# Patient Record
Sex: Female | Born: 1980 | Race: White | Hispanic: No | Smoking: Current every day smoker
Health system: Southern US, Community
[De-identification: ages and names within clinical notes are randomized; demographics above are authoritative.]

## PROBLEM LIST (undated history)

## (undated) DIAGNOSIS — F1290 Cannabis use, unspecified, uncomplicated: Secondary | ICD-10-CM

## (undated) DIAGNOSIS — B182 Chronic viral hepatitis C: Secondary | ICD-10-CM

## (undated) DIAGNOSIS — F329 Major depressive disorder, single episode, unspecified: Secondary | ICD-10-CM

## (undated) DIAGNOSIS — F988 Other specified behavioral and emotional disorders with onset usually occurring in childhood and adolescence: Secondary | ICD-10-CM

## (undated) DIAGNOSIS — F1910 Other psychoactive substance abuse, uncomplicated: Secondary | ICD-10-CM

## (undated) DIAGNOSIS — F32A Depression, unspecified: Secondary | ICD-10-CM

## (undated) DIAGNOSIS — D649 Anemia, unspecified: Secondary | ICD-10-CM

## (undated) DIAGNOSIS — A6000 Herpesviral infection of urogenital system, unspecified: Secondary | ICD-10-CM

## (undated) DIAGNOSIS — F419 Anxiety disorder, unspecified: Secondary | ICD-10-CM

## (undated) DIAGNOSIS — R569 Unspecified convulsions: Secondary | ICD-10-CM

## (undated) DIAGNOSIS — Z87898 Personal history of other specified conditions: Secondary | ICD-10-CM

## (undated) HISTORY — DX: Unspecified convulsions: R56.9

## (undated) HISTORY — PX: NO PAST SURGERIES: SHX2092

## (undated) HISTORY — DX: Anxiety disorder, unspecified: F41.9

## (undated) HISTORY — DX: Other psychoactive substance abuse, uncomplicated: F19.10

## (undated) HISTORY — DX: Anemia, unspecified: D64.9

---
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
## 2009-07-02 ENCOUNTER — Inpatient Hospital Stay: Payer: Self-pay | Admitting: Psychiatry

## 2010-06-17 ENCOUNTER — Emergency Department: Payer: Self-pay | Admitting: Internal Medicine

## 2010-08-19 ENCOUNTER — Emergency Department: Payer: Self-pay | Admitting: Unknown Physician Specialty

## 2010-09-19 ENCOUNTER — Inpatient Hospital Stay: Payer: Self-pay | Admitting: Psychiatry

## 2010-10-08 ENCOUNTER — Encounter: Payer: Self-pay | Admitting: Maternal & Fetal Medicine

## 2010-10-08 ENCOUNTER — Emergency Department: Payer: Self-pay | Admitting: *Deleted

## 2010-12-10 ENCOUNTER — Encounter: Payer: Self-pay | Admitting: Obstetrics and Gynecology

## 2010-12-13 ENCOUNTER — Encounter: Payer: Self-pay | Admitting: Obstetrics and Gynecology

## 2010-12-31 ENCOUNTER — Observation Stay: Payer: Self-pay

## 2011-03-15 ENCOUNTER — Inpatient Hospital Stay: Payer: Self-pay

## 2011-03-15 LAB — CBC WITH DIFFERENTIAL/PLATELET
Basophil #: 0 10*3/uL (ref 0.0–0.1)
Eosinophil #: 0 10*3/uL (ref 0.0–0.7)
HCT: 31.7 % — ABNORMAL LOW (ref 35.0–47.0)
Lymphocyte #: 1.6 10*3/uL (ref 1.0–3.6)
Lymphocyte %: 14.3 %
MCHC: 35 g/dL (ref 32.0–36.0)
MCV: 92 fL (ref 80–100)
Neutrophil #: 8.8 10*3/uL — ABNORMAL HIGH (ref 1.4–6.5)
Platelet: 233 10*3/uL (ref 150–440)
RBC: 3.45 10*6/uL — ABNORMAL LOW (ref 3.80–5.20)
RDW: 11.5 % (ref 11.5–14.5)

## 2011-03-15 LAB — DRUG SCREEN, URINE
Barbiturates, Ur Screen: NEGATIVE (ref ?–200)
Cocaine Metabolite,Ur ~~LOC~~: NEGATIVE (ref ?–300)
Methadone, Ur Screen: NEGATIVE (ref ?–300)
Opiate, Ur Screen: NEGATIVE (ref ?–300)
Phencyclidine (PCP) Ur S: NEGATIVE (ref ?–25)

## 2011-03-16 LAB — HEMATOCRIT: HCT: 31.9 % — ABNORMAL LOW (ref 35.0–47.0)

## 2011-04-24 ENCOUNTER — Emergency Department: Payer: Self-pay | Admitting: Unknown Physician Specialty

## 2011-04-24 LAB — URINALYSIS, COMPLETE
Bilirubin,UR: NEGATIVE
Glucose,UR: NEGATIVE mg/dL (ref 0–75)
Leukocyte Esterase: NEGATIVE
Ph: 5 (ref 4.5–8.0)
Protein: NEGATIVE
RBC,UR: 6 /HPF (ref 0–5)
Specific Gravity: 1.016 (ref 1.003–1.030)
Squamous Epithelial: 3
WBC UR: 10 /HPF (ref 0–5)

## 2011-04-24 LAB — COMPREHENSIVE METABOLIC PANEL
Albumin: 3.8 g/dL (ref 3.4–5.0)
Alkaline Phosphatase: 68 U/L (ref 50–136)
Anion Gap: 6 — ABNORMAL LOW (ref 7–16)
Calcium, Total: 8.5 mg/dL (ref 8.5–10.1)
Chloride: 106 mmol/L (ref 98–107)
Creatinine: 0.74 mg/dL (ref 0.60–1.30)
Glucose: 90 mg/dL (ref 65–99)
Osmolality: 275 (ref 275–301)
Sodium: 138 mmol/L (ref 136–145)
Total Protein: 7.8 g/dL (ref 6.4–8.2)

## 2011-04-24 LAB — CBC
MCH: 31.4 pg (ref 26.0–34.0)
MCHC: 33.9 g/dL (ref 32.0–36.0)
Platelet: 167 10*3/uL (ref 150–440)
RBC: 3.8 10*6/uL (ref 3.80–5.20)
RDW: 12.5 % (ref 11.5–14.5)

## 2011-04-24 LAB — DRUG SCREEN, URINE
Amphetamines, Ur Screen: NEGATIVE (ref ?–1000)
Benzodiazepine, Ur Scrn: NEGATIVE (ref ?–200)
Cocaine Metabolite,Ur ~~LOC~~: NEGATIVE (ref ?–300)
MDMA (Ecstasy)Ur Screen: NEGATIVE (ref ?–500)
Methadone, Ur Screen: NEGATIVE (ref ?–300)
Phencyclidine (PCP) Ur S: POSITIVE (ref ?–25)

## 2011-04-24 LAB — MAGNESIUM: Magnesium: 1.8 mg/dL

## 2011-04-24 LAB — TSH: Thyroid Stimulating Horm: 3.15 u[IU]/mL

## 2011-04-24 LAB — CK TOTAL AND CKMB (NOT AT ARMC)
CK, Total: 302 U/L — ABNORMAL HIGH (ref 21–215)
CK-MB: 3.9 ng/mL — ABNORMAL HIGH (ref 0.5–3.6)

## 2011-10-21 ENCOUNTER — Emergency Department: Payer: Self-pay | Admitting: Emergency Medicine

## 2012-06-30 IMAGING — CT CT HEAD WITHOUT CONTRAST
2 series · 16 of 30 positions shown, 20 images · non-contrast
Comparison: none

REASON FOR EXAM: sz
COMMENTS:

PROCEDURE:     CT  - CT HEAD WITHOUT CONTRAST  - April 24, 2011 [DATE]
RESULT:     Comparison:  None
TECHNIQUE: Multiple axial images from the foramen magnum to the vertex were
obtained without IV contrast.

[Series 2: without · axial · non-contrast · 0.44mm/px · z∈[+417,+542]mm · 13 of 31 slices shown, 17 images]
[im 3/31  brain]
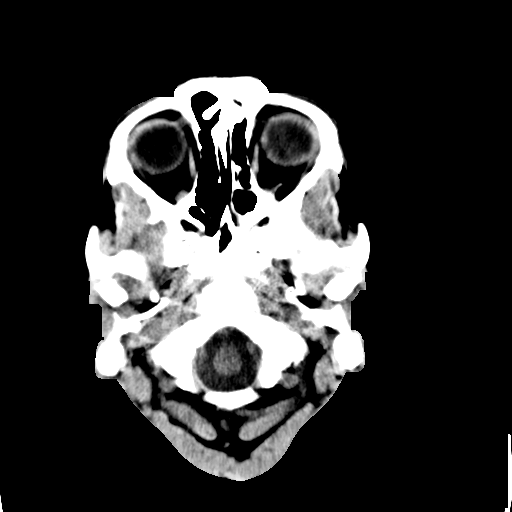
[im 3/31  bone]
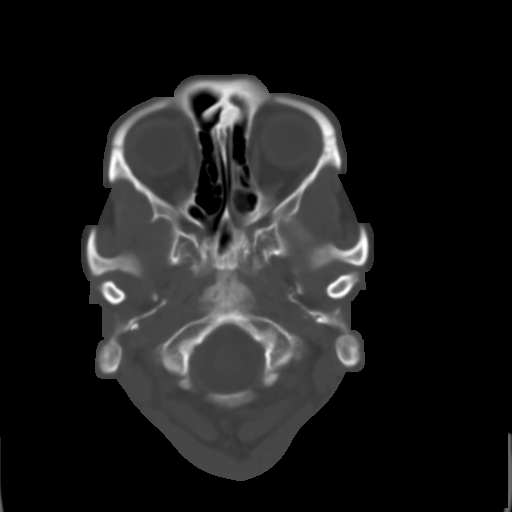
[im 5/31  brain]
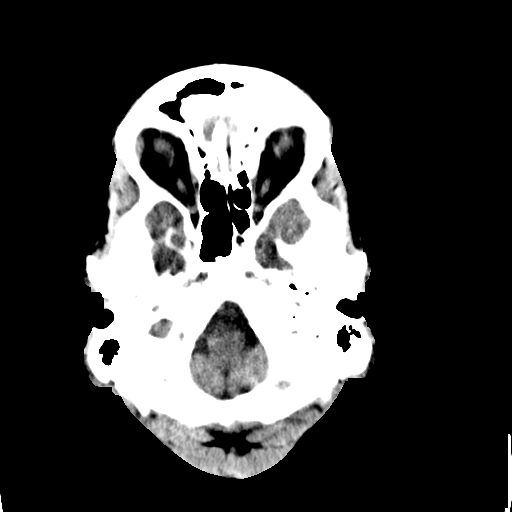
[im 7/31  brain]
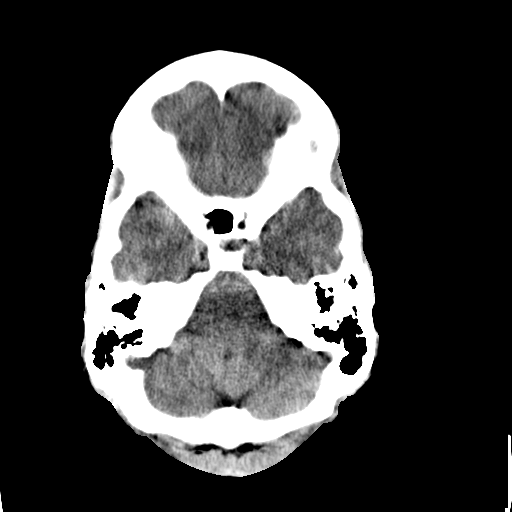
[im 9/31  brain]
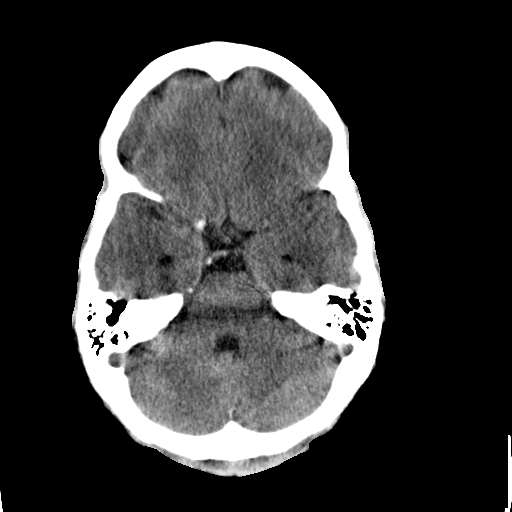
[im 11/31  brain]
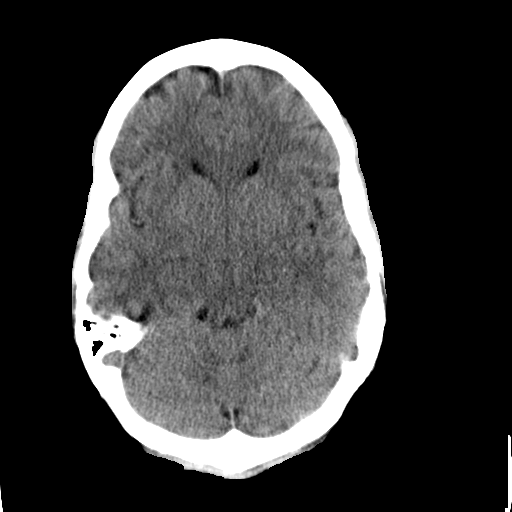
[im 11/31  bone]
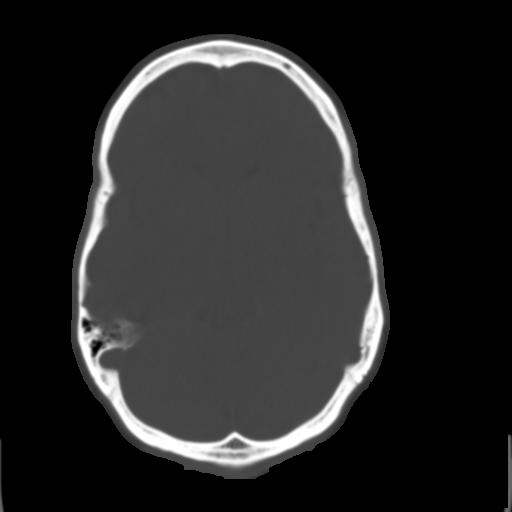
[im 13/31  brain]
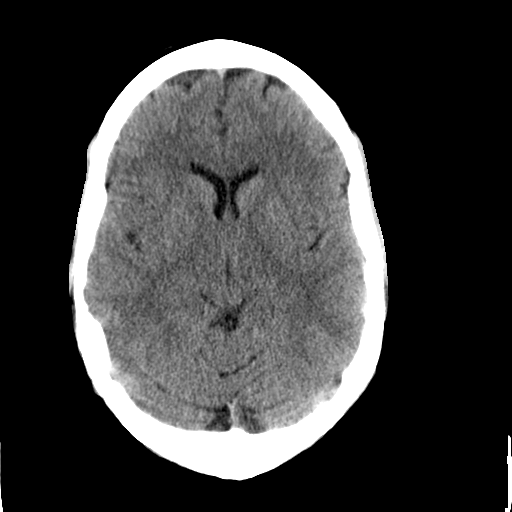
[im 16/31  brain]
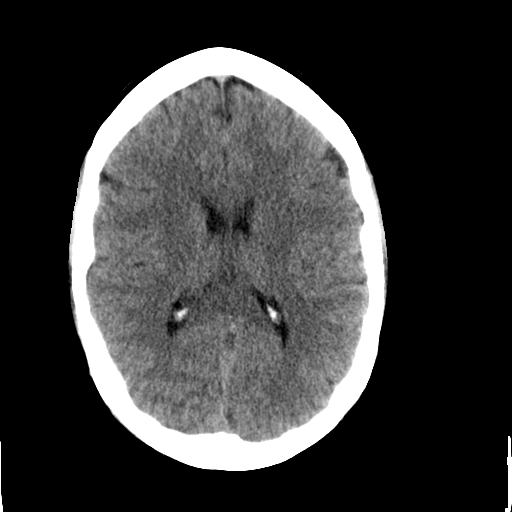
[im 18/31  brain]
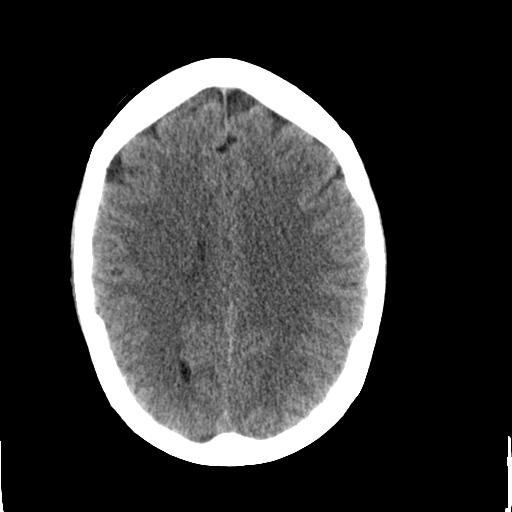
[im 20/31  brain]
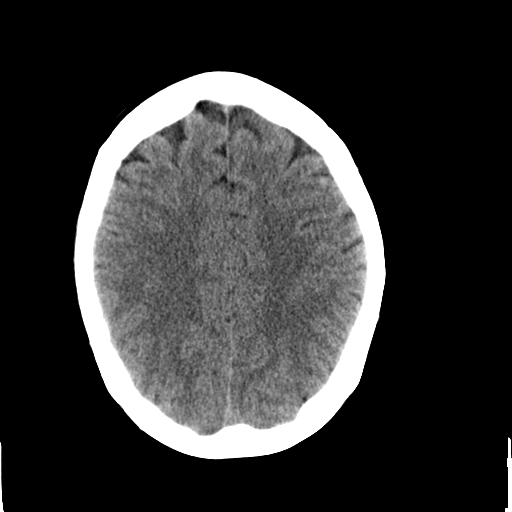
[im 20/31  bone]
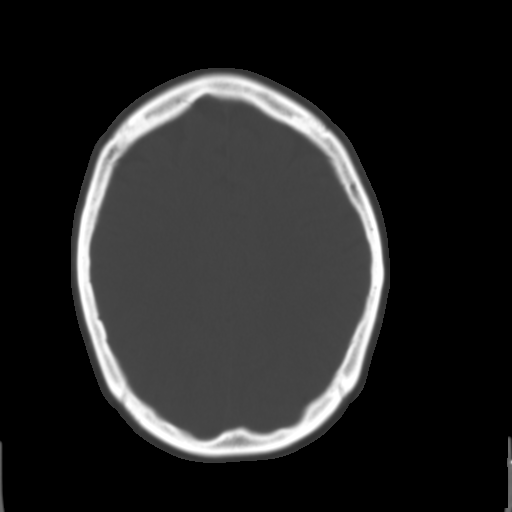
[im 22/31  brain]
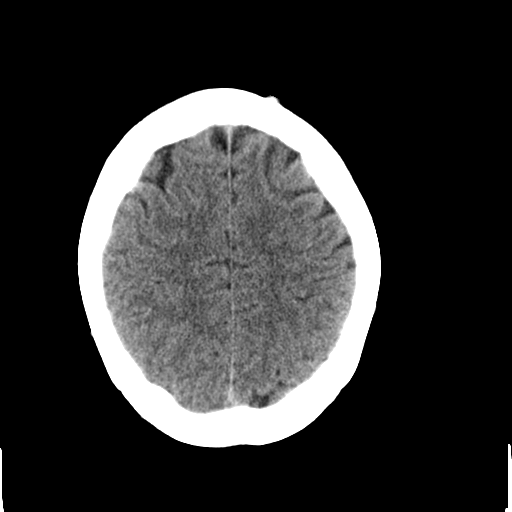
[im 24/31  brain]
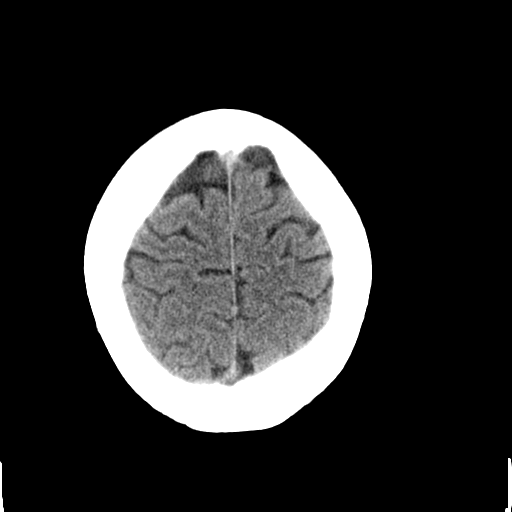
[im 26/31  brain]
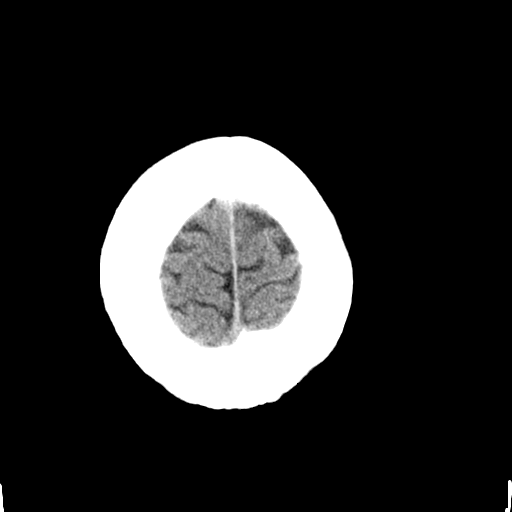
[im 28/31  brain]
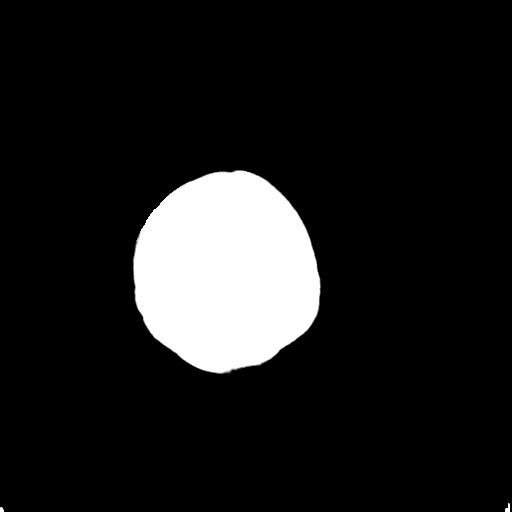
[im 28/31  bone]
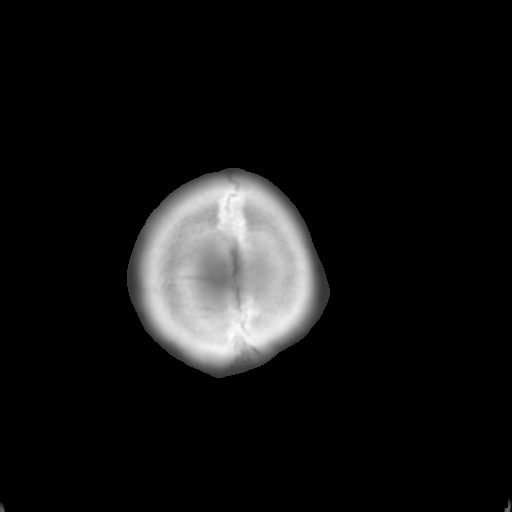

[Series 3: bone · axial · 0.44mm/px · z∈[+417,+457]mm · 3 of 31 slices shown]
[im 3/31  bone]
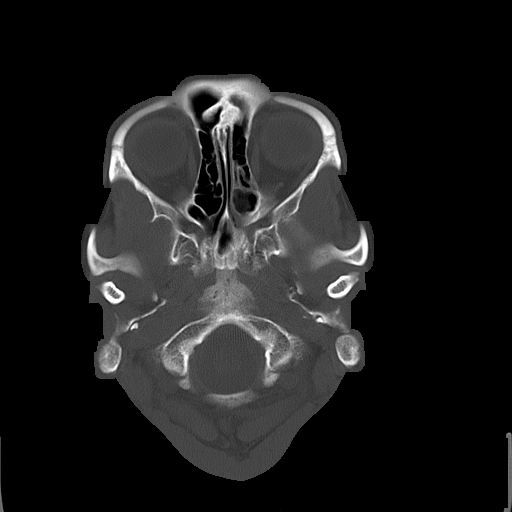
[im 7/31  bone]
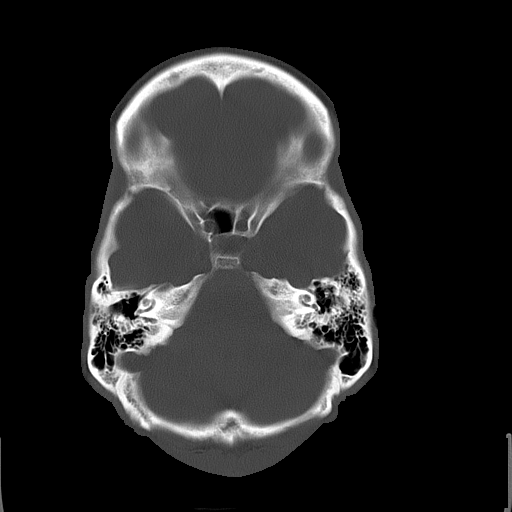
[im 11/31  bone]
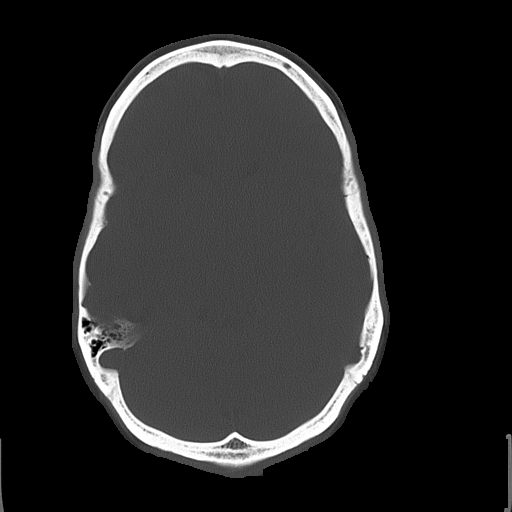

[16 of 30 positions shown; findings below may reference images not displayed]

FINDINGS: There is no evidence for mass effect, midline shift, or extra-axial fluid
collections. There is no evidence for space-occupying lesion, intracranial
hemorrhage, or cortical-based area of infarction. Small focus of soft tissue
thickening in the left frontal scalp is nonspecific.

The osseous structures are unremarkable.
IMPRESSION: No acute intracranial process.

## 2016-02-09 ENCOUNTER — Encounter: Payer: Self-pay | Admitting: Emergency Medicine

## 2016-02-09 ENCOUNTER — Emergency Department: Payer: Self-pay

## 2016-02-09 ENCOUNTER — Emergency Department
Admission: EM | Admit: 2016-02-09 | Discharge: 2016-02-09 | Disposition: A | Discharge status: Home / Self Care | Payer: Self-pay | Attending: Emergency Medicine | Admitting: Emergency Medicine

## 2016-02-09 DIAGNOSIS — N3090 Cystitis, unspecified without hematuria: Secondary | ICD-10-CM | POA: Insufficient documentation

## 2016-02-09 DIAGNOSIS — F17200 Nicotine dependence, unspecified, uncomplicated: Secondary | ICD-10-CM | POA: Insufficient documentation

## 2016-02-09 LAB — URINALYSIS, COMPLETE (UACMP) WITH MICROSCOPIC
BACTERIA UA: NONE SEEN
BILIRUBIN URINE: NEGATIVE
GLUCOSE, UA: NEGATIVE mg/dL
KETONES UR: NEGATIVE mg/dL
NITRITE: NEGATIVE
PROTEIN: 100 mg/dL — AB
Specific Gravity, Urine: 1.02 (ref 1.005–1.030)
pH: 5 (ref 5.0–8.0)

## 2016-02-09 LAB — COMPREHENSIVE METABOLIC PANEL
ALBUMIN: 4.1 g/dL (ref 3.5–5.0)
ALT: 188 U/L — ABNORMAL HIGH (ref 14–54)
ANION GAP: 6 (ref 5–15)
AST: 100 U/L — AB (ref 15–41)
Alkaline Phosphatase: 54 U/L (ref 38–126)
BILIRUBIN TOTAL: 0.8 mg/dL (ref 0.3–1.2)
BUN: 12 mg/dL (ref 6–20)
CHLORIDE: 103 mmol/L (ref 101–111)
CO2: 28 mmol/L (ref 22–32)
Calcium: 8.9 mg/dL (ref 8.9–10.3)
Creatinine, Ser: 0.63 mg/dL (ref 0.44–1.00)
GFR calc Af Amer: >60 mL/min (ref >60)
Glucose, Bld: 92 mg/dL (ref 65–99)
POTASSIUM: 3.7 mmol/L (ref 3.5–5.1)
Sodium: 137 mmol/L (ref 135–145)
TOTAL PROTEIN: 8 g/dL (ref 6.5–8.1)

## 2016-02-09 LAB — CBC
HEMATOCRIT: 35.7 % (ref 35.0–47.0)
HEMOGLOBIN: 12.6 g/dL (ref 12.0–16.0)
MCH: 30.9 pg (ref 26.0–34.0)
MCHC: 35.3 g/dL (ref 32.0–36.0)
MCV: 87.6 fL (ref 80.0–100.0)
Platelets: 274 10*3/uL (ref 150–440)
RBC: 4.08 MIL/uL (ref 3.80–5.20)
RDW: 13.2 % (ref 11.5–14.5)
WBC: 8.2 10*3/uL (ref 3.6–11.0)

## 2016-02-09 LAB — POCT PREGNANCY, URINE: PREG TEST UR: NEGATIVE

## 2016-02-09 MED ORDER — SULFAMETHOXAZOLE-TRIMETHOPRIM 800-160 MG PO TABS
1.0000 | ORAL_TABLET | Freq: Once | ORAL | Status: AC
  Administered 2016-02-09: 1 via ORAL
  Filled 2016-02-09: qty 1

## 2016-02-09 MED ORDER — SULFAMETHOXAZOLE-TRIMETHOPRIM 800-160 MG PO TABS: 1.0000 | ORAL_TABLET | Freq: Two times a day (BID) | ORAL | 0 refills | Status: DC

## 2016-02-09 MED ORDER — PHENAZOPYRIDINE HCL 200 MG PO TABS
200.0000 mg | ORAL_TABLET | Freq: Once | ORAL | Status: AC
  Administered 2016-02-09: 200 mg via ORAL
  Filled 2016-02-09 (×2): qty 1

## 2016-02-09 MED ORDER — PHENAZOPYRIDINE HCL 200 MG PO TABS: 200.0000 mg | ORAL_TABLET | Freq: Three times a day (TID) | ORAL | 0 refills | Status: AC | PRN

## 2016-02-09 NOTE — ED Triage Notes (Addendum)
Says urinary urgency and frequency for about 1.5 weeks ago.  Also low mid abd pressure.  She says there may have been clot of blood in urine today, but she also has menses now.  Has been trying home remedies without relief.  Also says she has excessive thirst and would like to be checked for diabetes.

## 2016-02-09 NOTE — ED Provider Notes (Signed)
[facility] Emergency Department Provider Note        Time seen: ----------------------------------------- 2:27 PM on 02/09/2016 -----------------------------------------    I have reviewed the triage vital signs and the nursing notes.   HISTORY  Chief Complaint Urinary Frequency; Hematuria; and Polydipsia    HPI Jillian Phillips is a 35 y.o. female who presents to the ER for urinary frequency and urgency for about a week and half. She also has had some pelvic pressure. Patient states there may have been a clot in her urine today but she also has her menses currently. She did try and home remedies without improvement. She also has excessive thirst and wants to be checked for diabetes. Boyfriend is concerned she may have a kidney stone.   History reviewed. No pertinent past medical history.  There are no active problems to display for this patient.   History reviewed. No pertinent surgical history.  Allergies Patient has no known allergies.  Social History Social History  Substance Use Topics  . Smoking status: Current Every Day Smoker  . Smokeless tobacco: Never Used  . Alcohol use No    Review of Systems Constitutional: Negative for fever. Cardiovascular: Negative for chest pain. Respiratory: Negative for shortness of breath. Gastrointestinal: Positive for abdominal pain Genitourinary: Positive for dysuria Musculoskeletal: Negative for back pain. Skin: Negative for rash. Neurological: Negative for headaches, focal weakness or numbness.  10-point ROS otherwise negative.  ____________________________________________   PHYSICAL EXAM:  VITAL SIGNS: ED Triage Vitals  Enc Vitals Group     BP 02/09/16 1029 (!) 136/93     Pulse Rate 02/09/16 1029 90     Resp 02/09/16 1029 16     Temp 02/09/16 1029 97.4 F (36.3 C)     Temp Source 02/09/16 1029 Oral     SpO2 02/09/16 1029 97 %     Weight 02/09/16 1030 145 lb (65.8 kg)     Height  02/09/16 1030 5\' 8"  (1.727 m)     Head Circumference --      Peak Flow --      Pain Score 02/09/16 1031 7     Pain Loc --      Pain Edu? --      Excl. in [place]? --    Constitutional: Alert and oriented. Well appearing and in no distress. Eyes: Conjunctivae are normal. PERRL. Normal extraocular movements. ENT   Head: Normocephalic and atraumatic.   Nose: No congestion/rhinnorhea.   Mouth/Throat: Mucous membranes are moist.   Neck: No stridor. Cardiovascular: Normal rate, regular rhythm. No murmurs, rubs, or gallops. Respiratory: Normal respiratory effort without tachypnea nor retractions. Breath sounds are clear and equal bilaterally. No wheezes/rales/rhonchi. Gastrointestinal: Soft and nontender. Normal bowel sounds Musculoskeletal: Nontender with normal range of motion in all extremities. No lower extremity tenderness nor edema. Neurologic:  Normal speech and language. No gross focal neurologic deficits are appreciated.  Skin:  Skin is warm, dry and intact. No rash noted. Psychiatric: Mood and affect are normal. Speech and behavior are normal.  ___________________________________________  ED COURSE:  Pertinent labs & imaging results that were available during my care of the patient were reviewed by me and considered in my medical decision making (see chart for details). Clinical Course   Patient is no distress, we will assess with labs and possibly imaging.  Procedures ____________________________________________   LABS (pertinent positives/negatives)  Labs Reviewed  COMPREHENSIVE METABOLIC PANEL - Abnormal; Notable for the following:       Result Value  AST 100 (*)    ALT 188 (*)    All other components within normal limits  URINALYSIS, COMPLETE (UACMP) WITH MICROSCOPIC - Abnormal; Notable for the following:    Color, Urine AMBER (*)    APPearance CLOUDY (*)    Hgb urine dipstick LARGE (*)    Protein, ur 100 (*)    Leukocytes, UA MODERATE (*)    Squamous  Epithelial / LPF 0-5 (*)    All other components within normal limits  CBC  POC URINE PREG, ED  POCT PREGNANCY, URINE    RADIOLOGY  KUB IMPRESSION: Probable 2 adjacent small left lower pole renal calculi. No ureteral calculi are seen. ____________________________________________  FINAL ASSESSMENT AND PLAN  Cystitis  Plan: Patient with labs and imaging as dictated above. Patient has had no symptoms of renal colic and has evidence of UTI while on her menses. We will continue treatment with Pyridium and Septra DS. She is stable for outpatient follow-up.   Earleen Newport, MD   Note: This dictation was prepared with Dragon dictation. Any transcriptional errors that result from this process are unintentional    Earleen Newport, MD 02/09/16 1433

## 2018-10-16 ENCOUNTER — Emergency Department: Payer: Self-pay

## 2018-10-16 ENCOUNTER — Other Ambulatory Visit: Payer: Self-pay

## 2018-10-16 ENCOUNTER — Emergency Department
Admission: EM | Admit: 2018-10-16 | Discharge: 2018-10-16 | Disposition: A | Discharge status: Home / Self Care | Payer: Self-pay | Attending: Emergency Medicine | Admitting: Emergency Medicine

## 2018-10-16 DIAGNOSIS — Y999 Unspecified external cause status: Secondary | ICD-10-CM | POA: Insufficient documentation

## 2018-10-16 DIAGNOSIS — F17200 Nicotine dependence, unspecified, uncomplicated: Secondary | ICD-10-CM | POA: Insufficient documentation

## 2018-10-16 DIAGNOSIS — S61111A Laceration without foreign body of right thumb with damage to nail, initial encounter: Secondary | ICD-10-CM | POA: Insufficient documentation

## 2018-10-16 DIAGNOSIS — Z23 Encounter for immunization: Secondary | ICD-10-CM | POA: Insufficient documentation

## 2018-10-16 DIAGNOSIS — Y939 Activity, unspecified: Secondary | ICD-10-CM | POA: Insufficient documentation

## 2018-10-16 DIAGNOSIS — W268XXA Contact with other sharp object(s), not elsewhere classified, initial encounter: Secondary | ICD-10-CM | POA: Insufficient documentation

## 2018-10-16 DIAGNOSIS — Y929 Unspecified place or not applicable: Secondary | ICD-10-CM | POA: Insufficient documentation

## 2018-10-16 MED ORDER — LIDOCAINE HCL (PF) 1 % IJ SOLN
5.0000 mL | Freq: Once | INTRAMUSCULAR | Status: AC
  Administered 2018-10-16: 16:00:00 5 mL via INTRADERMAL
  Filled 2018-10-16: qty 5

## 2018-10-16 MED ORDER — TETANUS-DIPHTH-ACELL PERTUSSIS 5-2.5-18.5 LF-MCG/0.5 IM SUSP
0.5000 mL | Freq: Once | INTRAMUSCULAR | Status: AC
  Administered 2018-10-16: 16:00:00 0.5 mL via INTRAMUSCULAR
  Filled 2018-10-16: qty 0.5

## 2018-10-16 MED ORDER — CEPHALEXIN 500 MG PO CAPS: 500.0000 mg | ORAL_CAPSULE | Freq: Four times a day (QID) | ORAL | 0 refills | Status: AC

## 2018-10-16 NOTE — ED Notes (Addendum)
This RN has stopped by this pt's room x2 to perform wound care, with pt not being there. Other RN states that pt asked for restroom, restroom is empty at the time. Will check back shortly.  Caryl Pina, [place] made aware

## 2018-10-16 NOTE — ED Triage Notes (Signed)
Laceration to right hand, thumb with can approx 2 hours PTA. Thumb bleeding, rewrapped at time of triage. Pt alert and oriented X4, cooperative, RR even and unlabored, color WNL. Pt in NAD. Pt tearful.

## 2018-10-16 NOTE — ED Provider Notes (Signed)
[facility] Emergency Department Provider Note  ____________________________________________  Time seen: Approximately 3:48 PM  I have reviewed the triage vital signs and the nursing notes.   HISTORY  Chief Complaint Extremity Laceration    HPI Jillian Phillips is a 38 y.o. female that presents to the emergency department for evaluation of right thumb laceration.  Patient cut her thumb on a can that she was trying to open.  She is unsure of last tetanus.   History reviewed. No pertinent past medical history.  There are no active problems to display for this patient.   History reviewed. No pertinent surgical history.  Prior to Admission medications   Medication Sig Start Date End Date Taking? Authorizing Provider  cephALEXin (KEFLEX) 500 MG capsule Take 1 capsule (500 mg total) by mouth 4 (four) times daily for 10 days. 10/16/18 10/26/18  Laban Emperor, PA-C  sulfamethoxazole-trimethoprim (BACTRIM DS) 800-160 MG tablet Take 1 tablet by mouth 2 (two) times daily. 02/09/16   Earleen Newport, MD    Allergies Patient has no known allergies.  No family history on file.  Social History Social History   Tobacco Use  . Smoking status: Current Every Day Smoker  . Smokeless tobacco: Never Used  Substance Use Topics  . Alcohol use: No  . Drug use: Not on file     Review of Systems   Gastrointestinal: No abdominal pain.  No nausea, no vomiting.  Musculoskeletal: Positive for thumb pain. Skin: Negative for rash, ecchymosis.  Positive for laceration. Neurological: Negative for numbness or tingling   ____________________________________________   PHYSICAL EXAM:  VITAL SIGNS: ED Triage Vitals  Enc Vitals Group     BP 10/16/18 1506 (!) 183/91     Pulse Rate 10/16/18 1506 74     Resp 10/16/18 1506 20     Temp 10/16/18 1506 98.5 F (36.9 C)     Temp Source 10/16/18 1506 Oral     SpO2 10/16/18 1506 100 %     Weight 10/16/18 1506 158 lb (71.7  kg)     Height 10/16/18 1506 5\' 9"  (1.753 m)     Head Circumference --      Peak Flow --      Pain Score 10/16/18 1515 0     Pain Loc --      Pain Edu? --      Excl. in [place]? --      Constitutional: Alert and oriented. Well appearing and in no acute distress. Eyes: Conjunctivae are normal. PERRL. EOMI. Head: Atraumatic. ENT:      Ears:      Nose: No congestion/rhinnorhea.      Mouth/Throat: Mucous membranes are moist.  Neck: No stridor.   Cardiovascular: Normal rate, regular rhythm.  Good peripheral circulation. Respiratory: Normal respiratory effort without tachypnea or retractions. Lungs CTAB. Good air entry to the bases with no decreased or absent breath sounds. Musculoskeletal: Full range of motion to all extremities. No gross deformities appreciated. Neurologic:  Normal speech and language. No gross focal neurologic deficits are appreciated.  Skin:  Skin is warm. 2 cm flap laceration to right distal thumb through nail. Psychiatric: Mood and affect are normal. Speech and behavior are normal. Patient exhibits appropriate insight and judgement.   ____________________________________________   LABS (all labs ordered are listed, but only abnormal results are displayed)  Labs Reviewed - No data to display ____________________________________________  EKG   ____________________________________________  RADIOLOGY Robinette Haines, personally viewed and evaluated these images (plain  radiographs) as part of my medical decision making, as well as reviewing the written report by the radiologist.  No results found.  ____________________________________________    PROCEDURES  Procedure(s) performed:    Procedures  LACERATION REPAIR Performed by: Laban Emperor  Consent: Verbal consent obtained.  Consent given by: patient  Prepped and Draped in normal sterile fashion  Wound explored: No foreign bodies   Laceration Location: thumb  Laceration Length: 2  cm  Anesthesia: None  Local anesthetic: lidocaine 1% without epinephrine  Anesthetic total: 5  ml  Irrigation method: syringe  Amount of cleaning: 552ml normal saline  Skin closure: 4-0 nylon  Number of sutures: 8  Technique: Simple interrupted  Patient tolerance: Patient tolerated the procedure well with no immediate complications.  Medications  lidocaine (PF) (XYLOCAINE) 1 % injection 5 mL (5 mLs Intradermal Given 10/16/18 1609)  Tdap (BOOSTRIX) injection 0.5 mL (0.5 mLs Intramuscular Given 10/16/18 1608)     ____________________________________________   INITIAL IMPRESSION / ASSESSMENT AND PLAN / ED COURSE  Pertinent labs & imaging results that were available during my care of the patient were reviewed by me and considered in my medical decision making (see chart for details).  Review of the [place] CSRS was performed in accordance of the [place] prior to dispensing any controlled drugs.     Patient presented to the emergency department for evaluation after thumb injury.  Vital signs and exam are reassuring.  Patient declines x-ray.  Thumb was repaired with stitches.  Portion of nail that was damaged was removed.   Patient will be discharged home with prescriptions for Keflex. Patient is to follow up with primary care Ortho as directed. Patient is given ED precautions to return to the ED for any worsening or new symptoms.   Jillian Phillips was evaluated in Emergency Department on 10/16/2018 for the symptoms described in the history of present illness. She was evaluated in the context of the global COVID-19 pandemic, which necessitated consideration that the patient might be at risk for infection with the SARS-CoV-2 virus that causes COVID-19. Institutional protocols and algorithms that pertain to the evaluation of patients at risk for COVID-19 are in a state of rapid change based on information released by regulatory bodies including the CDC and federal and state organizations. These  policies and algorithms were followed during the patient's care in the ED.  ____________________________________________  FINAL CLINICAL IMPRESSION(S) / ED DIAGNOSES  Final diagnoses:  Laceration of right thumb without foreign body with damage to nail, initial encounter      NEW MEDICATIONS STARTED DURING THIS VISIT:  ED Discharge Orders         Ordered    cephALEXin (KEFLEX) 500 MG capsule  4 times daily     10/16/18 1704              This chart was dictated using voice recognition software/Dragon. Despite best efforts to proofread, errors can occur which can change the meaning. Any change was purely unintentional.    Laban Emperor, PA-C 10/16/18 2051    Harvest Dark, MD 10/16/18 [phone]

## 2018-10-16 NOTE — ED Notes (Signed)
PT not in room.  

## 2018-10-16 NOTE — ED Notes (Signed)
See triage note  Presents with laceration to right thumb  States she cut it on a cann  Laceration noted across nail

## 2019-02-12 NOTE — L&D Delivery Note (Signed)
Delivery Note At 11:30 AM a viable female was delivered via Vaginal, Spontaneous (Presentation: Right Occiput Anterior).  APGAR: 7, 9; weight pending.   Placenta status: Spontaneous, Intact.  Cord: 3 vessels with the following complications: None.  Cord pH: n/a  Anesthesia: Epidural Episiotomy: None Lacerations: None Suture Repair: n/a Qnt. Blood Loss (mL): 462  Mom to postpartum.  Baby to Couplet care / Skin to Skin.  Called to see patient.  Mom pushed to deliver a viable female infant.  The head followed by shoulders, which delivered without difficulty, and the rest of the body.  A triple nuchal cord noted and reduced easily at the perineum.  Baby to mom's chest.  Cord clamped and cut after > 1 min delay.  No cord blood obtained.  Placenta delivered spontaneously, intact, with a 3-vessel cord.  No vaginal, cervical, or perineal lacerations. All counts correct.  Hemostasis obtained with IV pitocin and fundal massage. She had several short bursts of increased bleeding. She was treated with misoprostol 800 mcg PR x 1.  QBL 462 mL.     Prentice Docker, MD 09/28/2019, 12:11 PM

## 2019-04-09 ENCOUNTER — Other Ambulatory Visit: Payer: Self-pay

## 2019-04-09 ENCOUNTER — Emergency Department
Admission: EM | Admit: 2019-04-09 | Discharge: 2019-04-09 | Disposition: A | Discharge status: Home / Self Care | Payer: Medicaid Other | Attending: Emergency Medicine | Admitting: Emergency Medicine

## 2019-04-09 ENCOUNTER — Emergency Department: Payer: Medicaid Other

## 2019-04-09 DIAGNOSIS — Z3A11 11 weeks gestation of pregnancy: Secondary | ICD-10-CM | POA: Diagnosis not present

## 2019-04-09 DIAGNOSIS — N939 Abnormal uterine and vaginal bleeding, unspecified: Secondary | ICD-10-CM

## 2019-04-09 DIAGNOSIS — O209 Hemorrhage in early pregnancy, unspecified: Secondary | ICD-10-CM | POA: Diagnosis present

## 2019-04-09 DIAGNOSIS — F17200 Nicotine dependence, unspecified, uncomplicated: Secondary | ICD-10-CM | POA: Diagnosis not present

## 2019-04-09 LAB — CBC
HCT: 38 % (ref 36.0–46.0)
Hemoglobin: 12.6 g/dL (ref 12.0–15.0)
MCH: 29.1 pg (ref 26.0–34.0)
MCHC: 33.2 g/dL (ref 30.0–36.0)
MCV: 87.8 fL (ref 80.0–100.0)
Platelets: 272 10*3/uL (ref 150–400)
RBC: 4.33 MIL/uL (ref 3.87–5.11)
RDW: 14.2 % (ref 11.5–15.5)
WBC: 9.6 10*3/uL (ref 4.0–10.5)
nRBC: 0 % (ref 0.0–0.2)

## 2019-04-09 LAB — HCG, QUANTITATIVE, PREGNANCY: hCG, Beta Chain, Quant, S: 101164 m[IU]/mL — ABNORMAL HIGH (ref <5)

## 2019-04-09 LAB — BASIC METABOLIC PANEL
Anion gap: 6 (ref 5–15)
BUN: 15 mg/dL (ref 6–20)
CO2: 25 mmol/L (ref 22–32)
Calcium: 8.9 mg/dL (ref 8.9–10.3)
Chloride: 101 mmol/L (ref 98–111)
Creatinine, Ser: 0.56 mg/dL (ref 0.44–1.00)
GFR calc Af Amer: >60 mL/min (ref >60)
GFR calc non Af Amer: >60 mL/min (ref >60)
Glucose, Bld: 110 mg/dL — ABNORMAL HIGH (ref 70–99)
Potassium: 3.7 mmol/L (ref 3.5–5.1)
Sodium: 132 mmol/L — ABNORMAL LOW (ref 135–145)

## 2019-04-09 LAB — POC URINE PREG, ED: Preg Test, Ur: POSITIVE — AB

## 2019-04-09 LAB — ABO/RH: ABO/RH(D): A POS

## 2019-04-09 NOTE — ED Triage Notes (Signed)
Pt comes via POV from home with c/o vaginal bleeding that started today. Pt states she took a home pregnancy test a couple weeks ago and it was positive.  Pt states she has not had a OBGYN visit yet or confirmed pregnancy through doctor.  Pt denies any abdominal cramping.

## 2019-04-09 NOTE — ED Notes (Signed)
Pt not in room for discharge vs or instructions

## 2019-04-09 NOTE — ED Provider Notes (Signed)
Emergency Department Provider Note  ____________________________________________  Time seen: Approximately 4:46 PM  I have reviewed the triage vital signs and the nursing notes.   HISTORY  Chief Complaint Vaginal Bleeding   Historian Patient     HPI Jillian Phillips is a 39 y.o. female G3 P2 presents to the emergency department with vaginal bleeding that started today after sexual intercourse.  Patient states that she has not had a menstrual cycle since December.  She has noticed some breast swelling and other symptoms consistent with her prior pregnancies.  She denies pelvic cramping or abdominal pain.  She states that she took an at home pregnancy test several weeks ago and it was positive.  She has not made an appointment to see an OB/GYN.  She denies nausea or vomiting. No other alleviating measures have been attempted.    History reviewed. No pertinent past medical history.   Immunizations up to date:  Yes.     History reviewed. No pertinent past medical history.  There are no problems to display for this patient.   History reviewed. No pertinent surgical history.  Prior to Admission medications   Medication Sig Start Date End Date Taking? Authorizing Provider  sulfamethoxazole-trimethoprim (BACTRIM DS) 800-160 MG tablet Take 1 tablet by mouth 2 (two) times daily. 02/09/16   Earleen Newport, MD    Allergies Patient has no known allergies.  No family history on file.  Social History Social History   Tobacco Use  . Smoking status: Current Every Day Smoker  . Smokeless tobacco: Never Used  Substance Use Topics  . Alcohol use: No  . Drug use: Not on file     Review of Systems  Constitutional: No fever/chills Eyes:  No discharge ENT: No upper respiratory complaints. Respiratory: no cough. No SOB/ use of accessory muscles to breath Gastrointestinal:   No nausea, no vomiting.  No diarrhea.  No constipation. Genitourinary: Patient has vaginal  bleeding.  Musculoskeletal: Negative for musculoskeletal pain. Skin: Negative for rash, abrasions, lacerations, ecchymosis.    ____________________________________________   PHYSICAL EXAM:  VITAL SIGNS: ED Triage Vitals  Enc Vitals Group     BP 04/09/19 1500 (!) 139/96     Pulse Rate 04/09/19 1500 94     Resp 04/09/19 1500 18     Temp 04/09/19 1500 97.6 F (36.4 C)     Temp src --      SpO2 04/09/19 1500 100 %     Weight 04/09/19 1501 160 lb (72.6 kg)     Height 04/09/19 1501 5\' 8"  (1.727 m)     Head Circumference --      Peak Flow --      Pain Score 04/09/19 1501 0     Pain Loc --      Pain Edu? --      Excl. in [place]? --      Constitutional: Alert and oriented. Well appearing and in no acute distress. Eyes: Conjunctivae are normal. PERRL. EOMI. Head: Atraumatic. ENT: Cardiovascular: Normal rate, regular rhythm. Normal S1 and S2.  Good peripheral circulation. Respiratory: Normal respiratory effort without tachypnea or retractions. Lungs CTAB. Good air entry to the bases with no decreased or absent breath sounds Gastrointestinal: Bowel sounds x 4 quadrants. Soft and nontender to palpation. No guarding or rigidity. No distention. Musculoskeletal: Full range of motion to all extremities. No obvious deformities noted Neurologic:  Normal for age. No gross focal neurologic deficits are appreciated.  Skin:  Skin is warm, dry and intact.  No rash noted. Psychiatric: Mood and affect are normal for age. Speech and behavior are normal.   ____________________________________________   LABS (all labs ordered are listed, but only abnormal results are displayed)  Labs Reviewed  BASIC METABOLIC PANEL - Abnormal; Notable for the following components:      Result Value   Sodium 132 (*)    Glucose, Bld 110 (*)    All other components within normal limits  HCG, QUANTITATIVE, PREGNANCY - Abnormal; Notable for the following components:   hCG, Beta Chain, Quant, S 101,164 (*)    All  other components within normal limits  POC URINE PREG, ED - Abnormal; Notable for the following components:   Preg Test, Ur Positive (*)    All other components within normal limits  CBC  ABO/RH   ____________________________________________  EKG   ____________________________________________  RADIOLOGY Unk Pinto, personally viewed and evaluated these images (plain radiographs) as part of my medical decision making, as well as reviewing the written report by the radiologist.    US OB Comp Less 14 Wks  Result Date: 04/09/2019 CLINICAL DATA:  Vaginal bleeding EXAM: OBSTETRIC <14 WK ULTRASOUND TECHNIQUE: Transabdominal ultrasound was performed for evaluation of the gestation as well as the maternal uterus and adnexal regions. COMPARISON:  None. FINDINGS: Intrauterine gestational sac: Single Yolk sac:  Visualized. Embryo:  Visualized. Cardiac Activity: Visualized. Heart Rate: 169 bpm CRL: 51.7  mm   11 w 6 d                  [place] EDC: 10/23/2019 Subchorionic hemorrhage:  None visualized. Maternal uterus/adnexae: No pelvic free fluid. Right ovarian corpus luteum cyst. IMPRESSION: Single live intrauterine pregnancy. Electronically Signed   By: Kathreen Devoid   On: 04/09/2019 17:29    ____________________________________________    PROCEDURES  Procedure(s) performed:     Procedures     Medications - No data to display   ____________________________________________   INITIAL IMPRESSION / ASSESSMENT AND PLAN / ED COURSE  Pertinent labs & imaging results that were available during my care of the patient were reviewed by me and considered in my medical decision making (see chart for details).      Assessment and Plan: Vaginal Bleeding 39 year old female presents to the emergency department with 1 episode of postcoital vaginal bleeding and concern for possible pregnancy  Vital signs are reassuring at triage.  On physical exam, abdomen was soft and nontender without  guarding.  Differential diagnosis included first trimester vaginal bleeding, subchorionic hematoma, threatened pregnancy, ectopic pregnancy  Pelvic ultrasound revealed a viable intrauterine pregnancy with reassuring fetal heart tones.  Patient states that vaginal bleeding has resolved and she has no discomfort.  Recommended that patient follow-up with OB/GYN for continued prenatal care.  Recommended starting prenatal vitamins.  Return precautions were given to return to the emergency department with new or worsening symptoms.  All patient questions were answered.   ____________________________________________  FINAL CLINICAL IMPRESSION(S) / ED DIAGNOSES  Final diagnoses:  Vaginal bleeding      NEW MEDICATIONS STARTED DURING THIS VISIT:  ED Discharge Orders    None          This chart was dictated using voice recognition software/Dragon. Despite best efforts to proofread, errors can occur which can change the meaning. Any change was purely unintentional.     Lannie Fields, PA-C 04/09/19 1806    Drenda Freeze, MD 04/10/19 [phone]

## 2019-04-26 ENCOUNTER — Ambulatory Visit: Payer: Medicaid Other | Admitting: Dermatology

## 2019-04-26 ENCOUNTER — Encounter: Payer: Self-pay | Admitting: Obstetrics and Gynecology

## 2019-04-27 ENCOUNTER — Encounter: Payer: Self-pay | Admitting: Obstetrics and Gynecology

## 2019-04-27 ENCOUNTER — Other Ambulatory Visit: Payer: Self-pay

## 2019-04-27 ENCOUNTER — Other Ambulatory Visit (HOSPITAL_COMMUNITY)
Admission: RE | Admit: 2019-04-27 | Discharge: 2019-04-27 | Disposition: A | Discharge status: Home / Self Care | Payer: Medicaid Other | Source: Ambulatory Visit | Attending: Obstetrics and Gynecology | Admitting: Obstetrics and Gynecology

## 2019-04-27 ENCOUNTER — Ambulatory Visit (INDEPENDENT_AMBULATORY_CARE_PROVIDER_SITE_OTHER): Payer: Medicaid Other | Admitting: Obstetrics and Gynecology

## 2019-04-27 VITALS — BP 124/82 | Wt 158.0 lb

## 2019-04-27 DIAGNOSIS — Z1379 Encounter for other screening for genetic and chromosomal anomalies: Secondary | ICD-10-CM

## 2019-04-27 DIAGNOSIS — Z3A14 14 weeks gestation of pregnancy: Secondary | ICD-10-CM

## 2019-04-27 DIAGNOSIS — O09522 Supervision of elderly multigravida, second trimester: Secondary | ICD-10-CM

## 2019-04-27 DIAGNOSIS — O0992 Supervision of high risk pregnancy, unspecified, second trimester: Secondary | ICD-10-CM

## 2019-04-27 DIAGNOSIS — O0990 Supervision of high risk pregnancy, unspecified, unspecified trimester: Secondary | ICD-10-CM | POA: Insufficient documentation

## 2019-04-27 DIAGNOSIS — O98412 Viral hepatitis complicating pregnancy, second trimester: Secondary | ICD-10-CM

## 2019-04-27 DIAGNOSIS — O98319 Other infections with a predominantly sexual mode of transmission complicating pregnancy, unspecified trimester: Secondary | ICD-10-CM | POA: Insufficient documentation

## 2019-04-27 DIAGNOSIS — Z31438 Encounter for other genetic testing of female for procreative management: Secondary | ICD-10-CM

## 2019-04-27 DIAGNOSIS — F1911 Other psychoactive substance abuse, in remission: Secondary | ICD-10-CM

## 2019-04-27 DIAGNOSIS — A6009 Herpesviral infection of other urogenital tract: Secondary | ICD-10-CM | POA: Insufficient documentation

## 2019-04-27 DIAGNOSIS — O98312 Other infections with a predominantly sexual mode of transmission complicating pregnancy, second trimester: Secondary | ICD-10-CM

## 2019-04-27 DIAGNOSIS — Z3A11 11 weeks gestation of pregnancy: Secondary | ICD-10-CM | POA: Insufficient documentation

## 2019-04-27 DIAGNOSIS — Z8639 Personal history of other endocrine, nutritional and metabolic disease: Secondary | ICD-10-CM | POA: Insufficient documentation

## 2019-04-27 DIAGNOSIS — O09529 Supervision of elderly multigravida, unspecified trimester: Secondary | ICD-10-CM | POA: Insufficient documentation

## 2019-04-27 DIAGNOSIS — O98419 Viral hepatitis complicating pregnancy, unspecified trimester: Secondary | ICD-10-CM | POA: Insufficient documentation

## 2019-04-27 DIAGNOSIS — Z363 Encounter for antenatal screening for malformations: Secondary | ICD-10-CM

## 2019-04-27 DIAGNOSIS — B189 Chronic viral hepatitis, unspecified: Secondary | ICD-10-CM | POA: Insufficient documentation

## 2019-04-27 DIAGNOSIS — Z8759 Personal history of other complications of pregnancy, childbirth and the puerperium: Secondary | ICD-10-CM | POA: Insufficient documentation

## 2019-04-27 NOTE — Progress Notes (Signed)
New Obstetric Patient H&P    Chief Complaint: "Desires prenatal care"   History of Present Illness: Patient is a 39 y.o. [id number] Not Hispanic or Latino female, presents with amenorrhea and positive home pregnancy test. Patient's last menstrual period was 01/15/2019 (approximate). and based on her  LMP, her EDD is Estimated Date of Delivery: 10/22/19 and her EGA is [redacted]w[redacted]d. Cycles are regular monthly. Her last pap smear was 03/30/2019 and was no abnormalitiesHPV negative.    Her last menstrual period was normal. Since her LMP she claims she has experienced some mild nausea, fatigue, and breast tendereness. She reports one episode of vaginal bleeding which self resolved. Was evaluated in the ER with viable 11 week IUP noted, Rh positive so rhogam was not indicated. Her past medical history is contibutory for a history of chronic hepatitis C, history of hypothyroidism, past history of substance abuse, as well as history of HSV. Her prior pregnancies are notable for IUGR in her G2 pregnancy.  There are cats in the home in the home  no She admits close contact with children on a regular basis  no  She has had chicken pox in the past no She has had Tuberculosis exposures, symptoms, or previously tested positive for TB   no Current or past history of domestic violence. no  Genetic Screening/Teratology Counseling: (Includes patient, baby's father, or anyone in either family with:)   26. Patient's age >/= 59 at [facility]  yes 2. Thalassemia ([place], [place], [place], or Asian background): MCV<80  no 3. Neural tube defect (meningomyelocele, spina bifida, anencephaly)  no 4. Congenital heart defect  no  5. Down syndrome  no 6. Tay-Sachs (Jewish, [place])  no 7. Canavan's Disease  no 8. Sickle cell disease or trait (African)  no  9. Hemophilia or other blood disorders  no  10. Muscular dystrophy  no  11. Cystic fibrosis  no  12. Huntington's Chorea  no  13. Mental retardation/autism  Yes 5  year old son with autism 30. Other inherited genetic or chromosomal disorder  no 15. Maternal metabolic disorder (DM, PKU, etc)  no 16. Patient or FOB with a child with a birth defect not listed above no  16a. Patient or FOB with a birth defect themselves no 17. Recurrent pregnancy loss, or stillbirth  no  18. Any medications since LMP other than prenatal vitamins (include vitamins, supplements, OTC meds, drugs, alcohol)  no 19. Any other genetic/environmental exposure to discuss  no  Infection History:   1. Lives with someone with TB or TB exposed  no  2. Patient or partner has history of genital herpes  yes 3. Rash or viral illness since LMP  no 4. History of STI (GC, CT, HPV, syphilis, HIV)  Yes remote history chlamydia as teen 5. History of recent travel :  no  Other pertinent information:  no     Review of Systems:10 point review of systems negative unless otherwise noted in HPI  Past Medical History:  Patient Active Problem List   Diagnosis Date Noted  . History of prior pregnancy with IUGR newborn 04/27/2019  . History of hypothyroidism 04/27/2019  . History of substance abuse ([place]) 04/27/2019  . Chronic viral hepatitis complicating pregnancy ([place]) 04/27/2019  . Antepartum multigravida of advanced maternal age 66/16/2021  . Supervision of high risk pregnancy, antepartum 04/27/2019    Clinic [facility] Prenatal Labs  Dating LMP = 11 week [place] Blood type: --/--/A POS Performed at [facility], [address] [place], [place] [address]  (02/26 1505)   Genetic Screen 1 Screen:    AFP:     Quad:     NIPS: SMA:  FragileX:  CF: Antibody:   Anatomic [place]  Rubella:   Varicella: @VZVIGG @  GTT  RPR:     Rhogam  HBsAg:     TDaP vaccine                       Flu Shot: HIV:     Baby Food                                GBS:   Contraception  GBS prior pregnancy:  Pelvis Tested  7lbs 10oz G1 Pap: 03/30/2019 (need results)  CS/VBAC N/A   Support Person          Past  Surgical History:  Past Surgical History:  Procedure Laterality Date  . NO PAST SURGERIES      Gynecologic History: Patient's last menstrual period was 01/15/2019 (approximate).  Obstetric History: [id number]  Family History:  History reviewed. No pertinent family history.  Social History:  Social History   Socioeconomic History  . Marital status: Single    Spouse name: Not on file  . Number of children: Not on file  . Years of education: Not on file  . Highest education level: Not on file  Occupational History  . Not on file  Tobacco Use  . Smoking status: Current Every Day Smoker  . Smokeless tobacco: Never Used  Substance and Sexual Activity  . Alcohol use: No  . Drug use: Not Currently  . Sexual activity: Yes    Birth control/protection: None  Other Topics Concern  . Not on file  Social History Narrative  . Not on file   Social Determinants of Health   Financial Resource Strain:   . Difficulty of Paying Living Expenses:   Food Insecurity:   . Worried About Charity fundraiser in the Last Year:   . Arboriculturist in the Last Year:   Transportation Needs:   . Film/video editor (Medical):   [name] Kitchen Lack of Transportation (Non-Medical):   Physical Activity:   . Days of Exercise per Week:   . Minutes of Exercise per Session:   Stress:   . Feeling of Stress :   Social Connections:   . Frequency of Communication with Friends and Family:   . Frequency of Social Gatherings with Friends and Family:   . Attends Religious Services:   . Active Member of Clubs or Organizations:   . Attends Archivist Meetings:   [name] Kitchen Marital Status:   Intimate Partner Violence:   . Fear of Current or Ex-Partner:   . Emotionally Abused:   [name] Kitchen Physically Abused:   . Sexually Abused:     Allergies:  No Known Allergies  Medications: Prior to Admission medications   Medication Sig Start Date End Date Taking? Authorizing Provider  amphetamine-dextroamphetamine (ADDERALL) 30  MG tablet Take 30 mg by mouth daily.   Yes [provider]  ARIPiprazole (ABILIFY) 10 MG tablet Take 10 mg by mouth daily.   Yes [provider]  venlafaxine XR (EFFEXOR-XR) 150 MG 24 hr capsule Take 150 mg by mouth daily with breakfast.   Yes [provider]    Physical Exam Vitals: Blood pressure 124/82, weight 158 lb (71.7 kg), last menstrual period 01/15/2019.  General: NAD HEENT: normocephalic, anicteric Pulmonary: No increased work of breathing Abdomen: NABS, soft, non-tender, non-distended.  Umbilicus without lesions.  No hepatomegaly, splenomegaly or masses palpable. No evidence of hernia.  Fetal heart tones 150 Genitourinary:  External: Normal external female genitalia.  Normal urethral meatus, normal  Bartholin's and Skene's glands.    Vagina: Normal vaginal mucosa, no evidence of prolapse.    Cervix: Grossly normal in appearance, no bleeding  Uterus:  enlarged, mobile, normal contour.  No CMT  Adnexa: ovaries non-enlarged, no adnexal masses  Rectal: deferred Extremities: no edema, erythema, or tenderness Neurologic: Grossly intact Psychiatric: mood appropriate, affect full   Assessment: 39 y.o. G3P2002 at [redacted]w[redacted]d presenting to initiate prenatal care  Plan: 1) Avoid alcoholic beverages. 2) Patient encouraged not to smoke.  3) Discontinue the use of all non-medicinal drugs and chemicals.  4) Take prenatal vitamins daily.  5) Nutrition, food safety (fish, cheese advisories, and high nitrite foods) and exercise discussed. 6) Hospital and practice style discussed with cross coverage system.  7) Genetic Screening, such as with 1st Trimester Screening, cell free fetal DNA, AFP testing, and Ultrasound, as well as with amniocentesis and CVS as appropriate, is discussed with patient. At the conclusion of today's visit patient requested genetic testing 8) Chronic HCV - viral load and baseline CMP obtained 9) History substance abuse - urine drug screen 10)  History of IUGR - close evaluation of fundal height, weight gain, with low threshold for growth scan in the third trimester 11) HSV - valtrex at 36 weeks 12) Hypothyroidism - noted in [facility] chart although not currently on meds, will evaluate with NOB labs 13) Anatomy scan ordered for next visit  Malachy Mood, MD, [place], [facility] 04/27/2019, 8:17 PM

## 2019-04-27 NOTE — Progress Notes (Signed)
NOB Er for bleeding

## 2019-04-29 LAB — URINE CULTURE

## 2019-04-29 LAB — CERVICOVAGINAL ANCILLARY ONLY
Chlamydia: NEGATIVE
Comment: NEGATIVE
Comment: NORMAL
Neisseria Gonorrhea: NEGATIVE

## 2019-04-30 LAB — COMPREHENSIVE METABOLIC PANEL
ALT: 36 IU/L — ABNORMAL HIGH (ref 0–32)
AST: 25 IU/L (ref 0–40)
Albumin/Globulin Ratio: 1.4 (ref 1.2–2.2)
Albumin: 3.9 g/dL (ref 3.8–4.8)
Alkaline Phosphatase: 50 IU/L (ref 39–117)
BUN/Creatinine Ratio: 19 (ref 9–23)
BUN: 10 mg/dL (ref 6–20)
Bilirubin Total: 0.3 mg/dL (ref 0.0–1.2)
CO2: 23 mmol/L (ref 20–29)
Calcium: 8.9 mg/dL (ref 8.7–10.2)
Chloride: 101 mmol/L (ref 96–106)
Creatinine, Ser: 0.52 mg/dL — ABNORMAL LOW (ref 0.57–1.00)
GFR calc Af Amer: 140 mL/min/{1.73_m2} (ref >59)
GFR calc non Af Amer: 122 mL/min/{1.73_m2} (ref >59)
Globulin, Total: 2.8 g/dL (ref 1.5–4.5)
Glucose: 80 mg/dL (ref 65–99)
Potassium: 4.4 mmol/L (ref 3.5–5.2)
Sodium: 136 mmol/L (ref 134–144)
Total Protein: 6.7 g/dL (ref 6.0–8.5)

## 2019-04-30 LAB — RPR+RH+ABO+RUB AB+AB SCR+CB...
Antibody Screen: NEGATIVE
HIV Screen 4th Generation wRfx: NONREACTIVE
Hematocrit: 33.8 % — ABNORMAL LOW (ref 34.0–46.6)
Hemoglobin: 11.6 g/dL (ref 11.1–15.9)
Hepatitis B Surface Ag: NEGATIVE
MCH: 29.4 pg (ref 26.6–33.0)
MCHC: 34.3 g/dL (ref 31.5–35.7)
MCV: 86 fL (ref 79–97)
Platelets: 275 10*3/uL (ref 150–450)
RBC: 3.95 x10E6/uL (ref 3.77–5.28)
RDW: 14 % (ref 11.7–15.4)
RPR Ser Ql: NONREACTIVE
Rh Factor: POSITIVE
Rubella Antibodies, IGG: 13.2 index (ref >0.99)
Varicella zoster IgG: 1432 index (ref >165)
WBC: 8.9 10*3/uL (ref 3.4–10.8)

## 2019-04-30 LAB — THYROID PANEL WITH TSH
Free Thyroxine Index: 1.5 (ref 1.2–4.9)
T3 Uptake Ratio: 13 % — ABNORMAL LOW (ref 24–39)
T4, Total: 11.2 ug/dL (ref 4.5–12.0)
TSH: 1.55 u[IU]/mL (ref 0.450–4.500)

## 2019-04-30 LAB — HEPATITIS C GENOTYPE

## 2019-04-30 LAB — HCV RNA BY PCR, QN RFX GENO
HCV Quant Baseline: 5270000 IU/mL
HCV log10: 6.722 log10 IU/mL

## 2019-05-01 LAB — MATERNIT 21 PLUS CORE, BLOOD
Fetal Fraction: 7
Result (T21): NEGATIVE
Trisomy 13 (Patau syndrome): NEGATIVE
Trisomy 18 (Edwards syndrome): NEGATIVE
Trisomy 21 (Down syndrome): NEGATIVE

## 2019-05-04 ENCOUNTER — Encounter: Payer: Self-pay | Admitting: Obstetrics and Gynecology

## 2019-05-04 DIAGNOSIS — R8271 Bacteriuria: Secondary | ICD-10-CM | POA: Insufficient documentation

## 2019-05-04 LAB — URINE DRUG PANEL 7
Amphetamines, Urine: POSITIVE — AB
Barbiturate Quant, Ur: NEGATIVE ng/mL
Benzodiazepine Quant, Ur: NEGATIVE ng/mL
Cannabinoid Quant, Ur: POSITIVE — AB
Cocaine (Metab.): NEGATIVE ng/mL
Opiate Quant, Ur: NEGATIVE ng/mL
PCP Quant, Ur: NEGATIVE ng/mL

## 2019-05-05 ENCOUNTER — Telehealth: Payer: Self-pay

## 2019-05-05 NOTE — Telephone Encounter (Signed)
Patient calling for Genetic test results. [id number]

## 2019-05-07 LAB — INHERITEST CORE(CF97,SMA,FRAX)

## 2019-05-20 NOTE — Telephone Encounter (Signed)
Discussed NIPT and Inheritest results w pt (all neg) She was also asking about testing for autism.  I explained there is not a specific test for that condition. She expressed reassurance over the normal testing above.  Barnett Applebaum, MD, [facility], [facility] 05/20/2019  4:56 PM

## 2019-05-20 NOTE — Telephone Encounter (Signed)
Patient calling for Genetic test results. (tearful) [phone]

## 2019-05-25 ENCOUNTER — Encounter: Payer: Medicaid Other | Admitting: Advanced Practice Midwife

## 2019-05-25 ENCOUNTER — Other Ambulatory Visit: Payer: Self-pay

## 2019-05-25 ENCOUNTER — Ambulatory Visit (INDEPENDENT_AMBULATORY_CARE_PROVIDER_SITE_OTHER): Payer: Medicaid Other

## 2019-05-25 DIAGNOSIS — Z3A18 18 weeks gestation of pregnancy: Secondary | ICD-10-CM

## 2019-05-25 DIAGNOSIS — Z363 Encounter for antenatal screening for malformations: Secondary | ICD-10-CM | POA: Diagnosis not present

## 2019-05-25 DIAGNOSIS — Z8759 Personal history of other complications of pregnancy, childbirth and the puerperium: Secondary | ICD-10-CM

## 2019-05-25 DIAGNOSIS — Z8639 Personal history of other endocrine, nutritional and metabolic disease: Secondary | ICD-10-CM

## 2019-05-25 DIAGNOSIS — O0990 Supervision of high risk pregnancy, unspecified, unspecified trimester: Secondary | ICD-10-CM

## 2019-05-25 DIAGNOSIS — O4443 Low lying placenta NOS or without hemorrhage, third trimester: Secondary | ICD-10-CM | POA: Diagnosis not present

## 2019-05-31 ENCOUNTER — Other Ambulatory Visit: Payer: Self-pay

## 2019-05-31 ENCOUNTER — Ambulatory Visit (INDEPENDENT_AMBULATORY_CARE_PROVIDER_SITE_OTHER): Payer: Medicaid Other | Admitting: Certified Nurse Midwife

## 2019-05-31 ENCOUNTER — Encounter: Payer: Self-pay | Admitting: Certified Nurse Midwife

## 2019-05-31 VITALS — BP 132/76 | Wt 164.0 lb

## 2019-05-31 DIAGNOSIS — O98412 Viral hepatitis complicating pregnancy, second trimester: Secondary | ICD-10-CM

## 2019-05-31 DIAGNOSIS — O0992 Supervision of high risk pregnancy, unspecified, second trimester: Secondary | ICD-10-CM

## 2019-05-31 DIAGNOSIS — O98319 Other infections with a predominantly sexual mode of transmission complicating pregnancy, unspecified trimester: Secondary | ICD-10-CM

## 2019-05-31 DIAGNOSIS — R8271 Bacteriuria: Secondary | ICD-10-CM

## 2019-05-31 DIAGNOSIS — B189 Chronic viral hepatitis, unspecified: Secondary | ICD-10-CM

## 2019-05-31 DIAGNOSIS — O98419 Viral hepatitis complicating pregnancy, unspecified trimester: Secondary | ICD-10-CM

## 2019-05-31 DIAGNOSIS — A6009 Herpesviral infection of other urogenital tract: Secondary | ICD-10-CM

## 2019-05-31 DIAGNOSIS — F1911 Other psychoactive substance abuse, in remission: Secondary | ICD-10-CM

## 2019-05-31 DIAGNOSIS — Z363 Encounter for antenatal screening for malformations: Secondary | ICD-10-CM

## 2019-05-31 DIAGNOSIS — O4442 Low lying placenta NOS or without hemorrhage, second trimester: Secondary | ICD-10-CM

## 2019-05-31 DIAGNOSIS — Z3A19 19 weeks gestation of pregnancy: Secondary | ICD-10-CM

## 2019-05-31 DIAGNOSIS — O98312 Other infections with a predominantly sexual mode of transmission complicating pregnancy, second trimester: Secondary | ICD-10-CM

## 2019-05-31 LAB — POCT URINALYSIS DIPSTICK OB
Glucose, UA: NEGATIVE
POC,PROTEIN,UA: NEGATIVE

## 2019-05-31 NOTE — Progress Notes (Signed)
  Routine Prenatal Care Visit  Subjective  Jillian Phillips is a 39 y.o. G3P2002 at [redacted]w[redacted]d being seen today for ongoing prenatal care.  She is currently monitored for the following issues for this high-risk pregnancy and has History of prior pregnancy with IUGR newborn; History of hypothyroidism; History of substance abuse ([place]); Chronic viral hepatitis complicating pregnancy ([place]); Antepartum multigravida of advanced maternal age; Supervision of high risk pregnancy, antepartum; Genital herpes affecting pregnancy, antepartum; and GBS bacteriuria on their problem list.  ----------------------------------------------------------------------------------- Patient reports no complaints.  Feeling fetal movement. No vaginal bleeding since last visit. Reports having undergone 2 years of rehab for substance abuse. +chronic hepatitis C. Partner does not have hepatitis. ----------------------------------------------------------------------------------- The following portions of the patient's history were reviewed and updated as appropriate: allergies, current medications, past family history, past medical history, past social history, past surgical history and problem list. Problem list updated.  Objective  Blood pressure 132/76, weight 164 lb (74.4 kg), last menstrual period 01/15/2019. Pregravid weight 158 lb (71.7 kg) Total Weight Gain 6 lb (2.722 kg) Urinalysis: Urine Protein Negative  Urine Glucose Negative  Fetal Status: Fetal Heart Rate (bpm): 144         General:  Alert, oriented and cooperative. Patient is in no acute distress.           Abdomen: Soft, gravid, FH at U, FHT 147     Pelvic:  Cervical exam deferred        Extremities: Normal range of motion.     Mental Status: Normal mood and affect. Normal behavior. Normal judgment and thought content.   Anatomy scan on 4/13 at 18wk4d : CGA 18 wk, with incomplete anatomy for cardiac views. Placental edge 1.3cm from cervix. MaterniT 21: diploid  XY UDS was positive for MJ and amphetamines (on Adderall)    Assessment   39 y.o. G3P2002 at [redacted]w[redacted]d by  10/22/2019, by Last Menstrual Period presenting for routine prenatal visit  Plan   Pregnancy#3 Problems (from 01/15/19 to present)    Problem Noted Resolved   GBS bacteriuria-treat intrapartum 05/04/2019 by Malachy Mood, MD No   History of prior pregnancy with IUGR newborn 04/27/2019 by Malachy Mood, MD No   History of hypothyroidism 04/27/2019 by Malachy Mood, MD No   Overview Signed 05/07/2019  1:40 PM by Malachy Mood, MD    Not on meds prior to pregnancy, normal thyroid function labs in 1st trimester      History of substance abuse ([place]) 04/27/2019 by Malachy Mood, MD No   Overview Signed 05/31/2019  8:20 AM by Imagene Riches, CNM    Urine drug screen at initial Ob visit: + THC, + amphetamines (Adderall).      Chronic viral hepatitis complicating pregnancy ([place]) 04/27/2019 by Malachy Mood, MD No   Overview Signed 05/07/2019  1:39 PM by Malachy Mood, MD    History hepatitis C with negative viral load on check in first trimester      Antepartum multigravida of advanced maternal age 25/16/2021 by Malachy Mood, MD No       Please refer to After Visit Summary for other counseling recommendations.  Plan referral to GI regarding treatment for hepatitis C after delivery ROB and FU anatomy scan in 4 weeks  Dalia Heading, CNM

## 2019-05-31 NOTE — Progress Notes (Signed)
ROB Anatomy scan 4/13

## 2019-06-01 ENCOUNTER — Telehealth: Payer: Self-pay

## 2019-06-01 NOTE — Telephone Encounter (Signed)
Sharyn Lull w/[facility] has received a refill request for patient's Adderall. She is inquiring if it is safe during pregnancy and if this is something we can/want to manage during her pregnancy or leave w/PCP handling. [id number]

## 2019-06-03 NOTE — Telephone Encounter (Signed)
They are calling to follow up on the message left on 06/01/19. Advised them CLG is out off the office and will return tomorrow

## 2019-06-04 NOTE — Telephone Encounter (Signed)
Patient f/u on request from her PCP on Adderall. Cb#[phone]

## 2019-06-04 NOTE — Telephone Encounter (Signed)
Called [facility] and told them it was OK to continue medicine and that they can continue to refill. Tried to call patient but her mailbox is not set up yet. [employer]

## 2019-06-04 NOTE — Telephone Encounter (Signed)
Jillian Phillips w/[facility] f/u on request regarding Adderall. Patient is waiting on refill. They need to know: 1) Is it safe for her to have during pregnancy? 2) If so are we willing to manage/prescribe during her pregnancy? 3)If not, they need a note stating it is safe for patient to take during pregnancy so they can continue management/rx/refills.

## 2019-06-04 NOTE — Telephone Encounter (Signed)
Pt aware.

## 2019-06-06 DIAGNOSIS — O4442 Low lying placenta NOS or without hemorrhage, second trimester: Secondary | ICD-10-CM | POA: Insufficient documentation

## 2019-06-22 ENCOUNTER — Telehealth: Payer: Self-pay

## 2019-06-22 NOTE — Telephone Encounter (Signed)
Pt called triage line stating she is currently at her PCP being seen for refill on medication,. She went to the bathroom and noticed blood in her urine. She is not cramping nor is it in her underwear, just in urine and when she wipes. Pt aware to get PCP to test for UTI. PT advised to keep an eye on it during the night, if increases go to ER. Pt voices an understanding.   Sending to on call provider as an [place]

## 2019-06-25 ENCOUNTER — Other Ambulatory Visit: Payer: Self-pay

## 2019-06-25 ENCOUNTER — Ambulatory Visit: Payer: Medicaid Other | Attending: Internal Medicine

## 2019-06-25 DIAGNOSIS — Z23 Encounter for immunization: Secondary | ICD-10-CM

## 2019-06-25 NOTE — Progress Notes (Signed)
   Covid-19 Vaccination Clinic  Name:  Jillian Phillips    MRN: [id number] DOB: [birth year]  06/25/2019  [name] was observed post Covid-19 immunization for 15 minutes without incident. She was provided with Vaccine Information Sheet and instruction to access the V-Safe system.   [name] was instructed to call 911 with any severe reactions post vaccine: [name] Kitchen Difficulty breathing  . Swelling of face and throat  . A fast heartbeat  . A bad rash all over body  . Dizziness and weakness   Immunizations Administered    Name Date Dose VIS Date Route   Pfizer COVID-19 Vaccine 06/25/2019  9:15 AM 0.3 mL 04/07/2018 Intramuscular   Manufacturer: [place]   Lot: [id number]   [place]: [id number]

## 2019-06-28 ENCOUNTER — Encounter: Payer: Medicaid Other | Admitting: Obstetrics & Gynecology

## 2019-06-28 ENCOUNTER — Other Ambulatory Visit: Payer: Medicaid Other

## 2019-06-30 ENCOUNTER — Observation Stay
Admission: EM | Admit: 2019-06-30 | Discharge: 2019-06-30 | Disposition: A | Discharge status: Home / Self Care | Payer: Medicaid Other | Attending: Obstetrics and Gynecology | Admitting: Obstetrics and Gynecology

## 2019-06-30 ENCOUNTER — Encounter: Payer: Self-pay | Admitting: Obstetrics and Gynecology

## 2019-06-30 ENCOUNTER — Other Ambulatory Visit: Payer: Self-pay

## 2019-06-30 DIAGNOSIS — Z3A00 Weeks of gestation of pregnancy not specified: Secondary | ICD-10-CM | POA: Insufficient documentation

## 2019-06-30 DIAGNOSIS — R109 Unspecified abdominal pain: Secondary | ICD-10-CM | POA: Diagnosis present

## 2019-06-30 DIAGNOSIS — O98419 Viral hepatitis complicating pregnancy, unspecified trimester: Secondary | ICD-10-CM

## 2019-06-30 DIAGNOSIS — O26892 Other specified pregnancy related conditions, second trimester: Secondary | ICD-10-CM | POA: Diagnosis not present

## 2019-06-30 DIAGNOSIS — Z79899 Other long term (current) drug therapy: Secondary | ICD-10-CM | POA: Diagnosis not present

## 2019-06-30 DIAGNOSIS — R39198 Other difficulties with micturition: Secondary | ICD-10-CM | POA: Diagnosis not present

## 2019-06-30 DIAGNOSIS — F1911 Other psychoactive substance abuse, in remission: Secondary | ICD-10-CM

## 2019-06-30 DIAGNOSIS — Z8759 Personal history of other complications of pregnancy, childbirth and the puerperium: Secondary | ICD-10-CM

## 2019-06-30 DIAGNOSIS — Z8639 Personal history of other endocrine, nutritional and metabolic disease: Secondary | ICD-10-CM

## 2019-06-30 DIAGNOSIS — O09529 Supervision of elderly multigravida, unspecified trimester: Secondary | ICD-10-CM

## 2019-06-30 DIAGNOSIS — R8271 Bacteriuria: Secondary | ICD-10-CM

## 2019-06-30 LAB — URINALYSIS, ROUTINE W REFLEX MICROSCOPIC
Bacteria, UA: NONE SEEN
Bilirubin Urine: NEGATIVE
Glucose, UA: NEGATIVE mg/dL
Ketones, ur: NEGATIVE mg/dL
Nitrite: NEGATIVE
Protein, ur: NEGATIVE mg/dL
RBC / HPF: >50 RBC/hpf — ABNORMAL HIGH (ref 0–5)
Specific Gravity, Urine: 1.02 (ref 1.005–1.030)
Squamous Epithelial / HPF: >50 — ABNORMAL HIGH (ref 0–5)
pH: 6 (ref 5.0–8.0)

## 2019-06-30 MED ORDER — ACETAMINOPHEN 500 MG PO TABS
1000.0000 mg | ORAL_TABLET | Freq: Four times a day (QID) | ORAL | Status: DC | PRN
  Administered 2019-06-30: 1000 mg via ORAL
  Filled 2019-06-30: qty 2

## 2019-06-30 NOTE — Discharge Summary (Signed)
Physician Discharge Summary  Patient ID: Jillian Phillips MRN: [id number] DOB/AGE: 38-Mar-1982 38 y.o.  Admit date: 06/30/2019 Discharge date: 06/30/2019  Admission Diagnoses: abdominal pain in pregnancy  Discharge Diagnoses:  Active Problems:   Abdominal pain in pregnancy, antepartum   Discharged Condition: good  Hospital Course: Patient presented to the hospital complaining of abdominal pain in pregnancy and difficulty urinating.  She was able to urinate in the hospital to give a urine sample.  She reports that she was diagnosed with a urinary tract infection last week by her primary care physician.  She was given a prescription for antibiotics but she did not start the antibiotics because she felt like her infection has gotten better on its own.  She reports that this evening though she began having difficulty urinating.  She denied any lower back pain or fevers at home.  She reported normal fetal movement.  She denied any vaginal bleeding or leaking.  She denied any contractions.  She reported that she took Motrin at home for pain.  Discussed with the patient that she should not use Motrin Aleve ibuprofen during pregnancy.  Encouraged her to use Tylenol as needed for pain or fever.  Asked to report back to the hospital if she had any fever or back pain.  Encouraged the patient to start her antibiotic at home.  Patient denied any barriers to taking the antibiotic.  She stated that she did have the medication at her house and would start taking it when when she was discharged.  She has an appointment to follow-up in the office next Monday.  Discussed that she can take over-the-counter Azo for 2 days as needed for pain with urination.  All questions answered for the patient.  Consults: None  Significant Diagnostic Studies: labs: UA, urine culture  Treatments: counseling  Discharge Exam: Blood pressure 139/71, pulse 71, temperature 98.3 F (36.8 C), temperature source Oral, resp. rate 18,  height 5\' 8"  (1.727 m), weight 74.8 kg, last menstrual period 01/15/2019. General appearance: alert  Disposition: Discharge disposition: 01-Home or Self Care        Allergies as of 06/30/2019   No Known Allergies     Medication List    TAKE these medications   amphetamine-dextroamphetamine 30 MG tablet Commonly known as: ADDERALL Take 30 mg by mouth daily.   ARIPiprazole 10 MG tablet Commonly known as: ABILIFY Take 10 mg by mouth daily.   venlafaxine XR 150 MG 24 hr capsule Commonly known as: EFFEXOR-XR Take 150 mg by mouth daily with breakfast.        Signed: Brylei Pedley R Taro Hidrogo 06/30/2019, 7:05 AM

## 2019-06-30 NOTE — OB Triage Note (Signed)
Discharge instructions, prescriptions, and follow-up care reviewed. All questions answered. Patient verbalized understanding. Patient discharged ambulatory.

## 2019-06-30 NOTE — OB Triage Note (Signed)
Pt presents to unit c/o abdominal cramping that started yesterday around 1400 and tonight could not let her sleep. Pt reports being told by primary provider that she had a UTI a week ago but she did not take her antibiotic. Pt reports nothing in the vagina in the last 24 hours. Pt reports urinary urgency. Pt reports taking 600mg  of ibuprofen PO around midnight. Pt reports +FM, denies vaginal bleeding or LOF. Doppler FHTs 140s. Toco applied and assessing.

## 2019-07-05 ENCOUNTER — Encounter: Payer: Medicaid Other | Admitting: Obstetrics & Gynecology

## 2019-07-05 ENCOUNTER — Ambulatory Visit: Payer: Medicaid Other

## 2019-07-19 ENCOUNTER — Other Ambulatory Visit: Payer: Self-pay

## 2019-07-19 ENCOUNTER — Ambulatory Visit (INDEPENDENT_AMBULATORY_CARE_PROVIDER_SITE_OTHER): Payer: Medicaid Other

## 2019-07-19 ENCOUNTER — Encounter: Payer: Self-pay | Admitting: Obstetrics & Gynecology

## 2019-07-19 ENCOUNTER — Ambulatory Visit (INDEPENDENT_AMBULATORY_CARE_PROVIDER_SITE_OTHER): Payer: Medicaid Other | Admitting: Obstetrics & Gynecology

## 2019-07-19 VITALS — BP 120/80 | Wt 176.0 lb

## 2019-07-19 DIAGNOSIS — O4442 Low lying placenta NOS or without hemorrhage, second trimester: Secondary | ICD-10-CM

## 2019-07-19 DIAGNOSIS — Z3A26 26 weeks gestation of pregnancy: Secondary | ICD-10-CM

## 2019-07-19 DIAGNOSIS — Z8639 Personal history of other endocrine, nutritional and metabolic disease: Secondary | ICD-10-CM

## 2019-07-19 DIAGNOSIS — O0992 Supervision of high risk pregnancy, unspecified, second trimester: Secondary | ICD-10-CM | POA: Diagnosis not present

## 2019-07-19 DIAGNOSIS — Z131 Encounter for screening for diabetes mellitus: Secondary | ICD-10-CM

## 2019-07-19 DIAGNOSIS — Z363 Encounter for antenatal screening for malformations: Secondary | ICD-10-CM | POA: Diagnosis not present

## 2019-07-19 DIAGNOSIS — O09529 Supervision of elderly multigravida, unspecified trimester: Secondary | ICD-10-CM

## 2019-07-19 DIAGNOSIS — F1911 Other psychoactive substance abuse, in remission: Secondary | ICD-10-CM

## 2019-07-19 NOTE — Progress Notes (Signed)
  Subjective  Fetal Movement? yes Contractions? no Leaking Fluid? no Vaginal Bleeding? no  Objective  BP 120/80   Wt 176 lb (79.8 kg)   LMP 01/15/2019 (Approximate)   BMI 26.76 kg/m  General: NAD Pumonary: no increased work of breathing Abdomen: gravid, non-tender Extremities: no edema Psychiatric: mood appropriate, affect full  Review of ULTRASOUND.    I have personally reviewed images and report of recent ultrasound done at [facility].    Plan of management to be discussed with patient.    Anatomy scan complete    Placenta 2 cm from cervical os  Assessment  39 y.o. G3P2002 at [redacted]w[redacted]d by  10/22/2019, by Last Menstrual Period presenting for routine prenatal visit  Plan   Problem List Items Addressed This Visit      Other   History of hypothyroidism   History of substance abuse ([place])   Antepartum multigravida of advanced maternal age   40 placenta in second trimester    Other Visit Diagnoses    [redacted] weeks gestation of pregnancy    -  Primary   Supervision of high risk pregnancy in second trimester       Relevant Orders   28 Week RH+Panel   Screening for diabetes mellitus       Relevant Orders   28 Week RH+Panel      Pregnancy#3 Problems (from 01/15/19 to present)    Problem Noted Resolved   GBS bacteriuria 05/04/2019 by Malachy Mood, MD No   Overview Signed 05/31/2019  8:56 AM by Dalia Heading, CNM    Treat intrapartum      History of prior pregnancy with IUGR newborn 04/27/2019 by Malachy Mood, MD No   History of hypothyroidism 04/27/2019 by Malachy Mood, MD No   Overview Signed 05/07/2019  1:40 PM by Malachy Mood, MD    Not on meds prior to pregnancy, normal thyroid function labs in 1st trimester      History of substance abuse ([place]) 04/27/2019 by Malachy Mood, MD No   Overview Signed 05/31/2019  8:20 AM by Imagene Riches, CNM    Urine drug screen at initial Ob visit: + THC, + amphetamines (Adderall).      Chronic viral  hepatitis complicating pregnancy ([place]) 04/27/2019 by Malachy Mood, MD No   Overview Signed 05/07/2019  1:39 PM by Malachy Mood, MD    History hepatitis C with negative viral load on check in first trimester      Antepartum multigravida of advanced maternal age 10/28/2019 by Malachy Mood, MD No               NIPT nml    Counseled as to contraception.  Desires OCPs. Plans to bottle feed PNV [facility] [place] discussed    Plan [place] for placenta location again at 34 weeks  Barnett Applebaum, MD, [facility], [facility] 07/19/2019  2:56 PM

## 2019-07-19 NOTE — Patient Instructions (Signed)
Third Trimester of Pregnancy The third trimester is from week 28 through week 40 (months 7 through 9). The third trimester is a time when the unborn baby (fetus) is growing rapidly. At the end of the ninth month, the fetus is about 20 inches in length and weighs 6-10 pounds. Body changes during your third trimester Your body will continue to go through many changes during pregnancy. The changes vary from woman to woman. During the third trimester:  Your weight will continue to increase. You can expect to gain 25-35 pounds (11-16 kg) by the end of the pregnancy.  You may begin to get stretch marks on your hips, abdomen, and breasts.  You may urinate more often because the fetus is moving lower into your pelvis and pressing on your bladder.  You may develop or continue to have heartburn. This is caused by increased hormones that slow down muscles in the digestive tract.  You may develop or continue to have constipation because increased hormones slow digestion and cause the muscles that push waste through your intestines to relax.  You may develop hemorrhoids. These are swollen veins (varicose veins) in the rectum that can itch or be painful.  You may develop swollen, bulging veins (varicose veins) in your legs.  You may have increased body aches in the pelvis, back, or thighs. This is due to weight gain and increased hormones that are relaxing your joints.  You may have changes in your hair. These can include thickening of your hair, rapid growth, and changes in texture. Some women also have hair loss during or after pregnancy, or hair that feels dry or thin. Your hair will most likely return to normal after your baby is born.  Your breasts will continue to grow and they will continue to become tender. A yellow fluid (colostrum) may leak from your breasts. This is the first milk you are producing for your baby.  Your belly button may stick out.  You may notice more swelling in your hands,  face, or ankles.  You may have increased tingling or numbness in your hands, arms, and legs. The skin on your belly may also feel numb.  You may feel short of breath because of your expanding uterus.  You may have more problems sleeping. This can be caused by the size of your belly, increased need to urinate, and an increase in your body's metabolism.  You may notice the fetus "dropping," or moving lower in your abdomen (lightening).  You may have increased vaginal discharge.  You may notice your joints feel loose and you may have pain around your pelvic bone. What to expect at prenatal visits You will have prenatal exams every 2 weeks until week 36. Then you will have weekly prenatal exams. During a routine prenatal visit:  You will be weighed to make sure you and the baby are growing normally.  Your blood pressure will be taken.  Your abdomen will be measured to track your baby's growth.  The fetal heartbeat will be listened to.  Any test results from the previous visit will be discussed.  You may have a cervical check near your due date to see if your cervix has softened or thinned (effaced).  You will be tested for Group B streptococcus. This happens between 35 and 37 weeks. Your health care provider may ask you:  What your birth plan is.  How you are feeling.  If you are feeling the baby move.  If you have had any abnormal   symptoms, such as leaking fluid, bleeding, severe headaches, or abdominal cramping.  If you are using any tobacco products, including cigarettes, chewing tobacco, and electronic cigarettes.  If you have any questions. Other tests or screenings that may be performed during your third trimester include:  Blood tests that check for low iron levels (anemia).  Fetal testing to check the health, activity level, and growth of the fetus. Testing is done if you have certain medical conditions or if there are problems during the pregnancy.  Nonstress test  (NST). This test checks the health of your baby to make sure there are no signs of problems, such as the baby not getting enough oxygen. During this test, a belt is placed around your belly. The baby is made to move, and its heart rate is monitored during movement. What is false labor? False labor is a condition in which you feel small, irregular tightenings of the muscles in the womb (contractions) that usually go away with rest, changing position, or drinking water. These are called Braxton Hicks contractions. Contractions may last for hours, days, or even weeks before true labor sets in. If contractions come at regular intervals, become more frequent, increase in intensity, or become painful, you should see your health care provider. What are the signs of labor?  Abdominal cramps.  Regular contractions that start at 10 minutes apart and become stronger and more frequent with time.  Contractions that start on the top of the uterus and spread down to the lower abdomen and back.  Increased pelvic pressure and dull back pain.  A watery or bloody mucus discharge that comes from the vagina.  Leaking of amniotic fluid. This is also known as your "water breaking." It could be a slow trickle or a gush. Let your health care provider know if it has a color or strange odor. If you have any of these signs, call your health care provider right away, even if it is before your due date. Follow these instructions at home: Medicines  Follow your health care provider's instructions regarding medicine use. Specific medicines may be either safe or unsafe to take during pregnancy.  Take a prenatal vitamin that contains at least 600 micrograms (mcg) of folic acid.  If you develop constipation, try taking a stool softener if your health care provider approves. Eating and drinking   Eat a balanced diet that includes fresh fruits and vegetables, whole grains, good sources of protein such as meat, eggs, or tofu,  and low-fat dairy. Your health care provider will help you determine the amount of weight gain that is right for you.  Avoid raw meat and uncooked cheese. These carry germs that can cause birth defects in the baby.  If you have low calcium intake from food, talk to your health care provider about whether you should take a daily calcium supplement.  Eat four or five small meals rather than three large meals a day.  Limit foods that are high in fat and processed sugars, such as fried and sweet foods.  To prevent constipation: ? Drink enough fluid to keep your urine clear or pale yellow. ? Eat foods that are high in fiber, such as fresh fruits and vegetables, whole grains, and beans. Activity  Exercise only as directed by your health care provider. Most women can continue their usual exercise routine during pregnancy. Try to exercise for 30 minutes at least 5 days a week. Stop exercising if you experience uterine contractions.  Avoid heavy lifting.  Do   not exercise in extreme heat or humidity, or at high altitudes.  Wear low-heel, comfortable shoes.  Practice good posture.  You may continue to have sex unless your health care provider tells you otherwise. Relieving pain and discomfort  Take frequent breaks and rest with your legs elevated if you have leg cramps or low back pain.  Take warm sitz baths to soothe any pain or discomfort caused by hemorrhoids. Use hemorrhoid cream if your health care provider approves.  Wear a good support bra to prevent discomfort from breast tenderness.  If you develop varicose veins: ? Wear support pantyhose or compression stockings as told by your healthcare provider. ? Elevate your feet for 15 minutes, 3-4 times a day. Prenatal care  Write down your questions. Take them to your prenatal visits.  Keep all your prenatal visits as told by your health care provider. This is important. Safety  Wear your seat belt at all times when driving.  Make  a list of emergency phone numbers, including numbers for family, friends, the hospital, and police and fire departments. General instructions  Avoid cat litter boxes and soil used by cats. These carry germs that can cause birth defects in the baby. If you have a cat, ask someone to clean the litter box for you.  Do not travel far distances unless it is absolutely necessary and only with the approval of your health care provider.  Do not use hot tubs, steam rooms, or saunas.  Do not drink alcohol.  Do not use any products that contain nicotine or tobacco, such as cigarettes and e-cigarettes. If you need help quitting, ask your health care provider.  Do not use any medicinal herbs or unprescribed drugs. These chemicals affect the formation and growth of the baby.  Do not douche or use tampons or scented sanitary pads.  Do not cross your legs for long periods of time.  To prepare for the arrival of your baby: ? Take prenatal classes to understand, practice, and ask questions about labor and delivery. ? Make a trial run to the hospital. ? Visit the hospital and tour the maternity area. ? Arrange for maternity or paternity leave through employers. ? Arrange for family and friends to take care of pets while you are in the hospital. ? Purchase a rear-facing car seat and make sure you know how to install it in your car. ? Pack your hospital bag. ? Prepare the baby's nursery. Make sure to remove all pillows and stuffed animals from the baby's crib to prevent suffocation.  Visit your dentist if you have not gone during your pregnancy. Use a soft toothbrush to brush your teeth and be gentle when you floss. Contact a health care provider if:  You are unsure if you are in labor or if your water has broken.  You become dizzy.  You have mild pelvic cramps, pelvic pressure, or nagging pain in your abdominal area.  You have lower back pain.  You have persistent nausea, vomiting, or  diarrhea.  You have an unusual or bad smelling vaginal discharge.  You have pain when you urinate. Get help right away if:  Your water breaks before 37 weeks.  You have regular contractions less than 5 minutes apart before 37 weeks.  You have a fever.  You are leaking fluid from your vagina.  You have spotting or bleeding from your vagina.  You have severe abdominal pain or cramping.  You have rapid weight loss or weight gain.  You have   shortness of breath with chest pain.  You notice sudden or extreme swelling of your face, hands, ankles, feet, or legs.  Your baby makes fewer than 10 movements in 2 hours.  You have severe headaches that do not go away when you take medicine.  You have vision changes. Summary  The third trimester is from week 28 through week 40, months 7 through 9. The third trimester is a time when the unborn baby (fetus) is growing rapidly.  During the third trimester, your discomfort may increase as you and your baby continue to gain weight. You may have abdominal, leg, and back pain, sleeping problems, and an increased need to urinate.  During the third trimester your breasts will keep growing and they will continue to become tender. A yellow fluid (colostrum) may leak from your breasts. This is the first milk you are producing for your baby.  False labor is a condition in which you feel small, irregular tightenings of the muscles in the womb (contractions) that eventually go away. These are called Braxton Hicks contractions. Contractions may last for hours, days, or even weeks before true labor sets in.  Signs of labor can include: abdominal cramps; regular contractions that start at 10 minutes apart and become stronger and more frequent with time; watery or bloody mucus discharge that comes from the vagina; increased pelvic pressure and dull back pain; and leaking of amniotic fluid. This information is not intended to replace advice given to you by your  health care provider. Make sure you discuss any questions you have with your health care provider. Document Revised: 05/21/2018 Document Reviewed: 03/05/2016 Elsevier Patient Education  2020 [employer]  

## 2019-07-20 ENCOUNTER — Ambulatory Visit: Payer: Medicaid Other | Attending: Internal Medicine

## 2019-07-20 DIAGNOSIS — Z23 Encounter for immunization: Secondary | ICD-10-CM

## 2019-07-20 NOTE — Progress Notes (Signed)
   Covid-19 Vaccination Clinic  Name:  Jillian Phillips    MRN: [id number] DOB: 21-Mar-1980  07/20/2019  [name] was observed post Covid-19 immunization for 15 minutes without incident. She was provided with Vaccine Information Sheet and instruction to access the V-Safe system.   [name] was instructed to call 911 with any severe reactions post vaccine: [name] Kitchen Difficulty breathing  . Swelling of face and throat  . A fast heartbeat  . A bad rash all over body  . Dizziness and weakness   Immunizations Administered    Name Date Dose VIS Date [place] COVID-19 Vaccine 07/20/2019  8:50 AM 0.3 mL 04/07/2018 Intramuscular   Manufacturer: [employer]   Lot: [id number]   [place]: [id number]

## 2019-07-26 ENCOUNTER — Ambulatory Visit: Payer: Medicaid Other | Admitting: Gastroenterology

## 2019-08-02 ENCOUNTER — Other Ambulatory Visit: Payer: Medicaid Other

## 2019-08-02 ENCOUNTER — Encounter: Payer: Medicaid Other | Admitting: Obstetrics and Gynecology

## 2019-08-23 ENCOUNTER — Telehealth: Payer: Self-pay | Admitting: Obstetrics and Gynecology

## 2019-08-23 NOTE — Telephone Encounter (Signed)
Tried calling patient, vm not set up. Patient needs to be set up for a HROB ASAP.

## 2019-08-30 ENCOUNTER — Ambulatory Visit (INDEPENDENT_AMBULATORY_CARE_PROVIDER_SITE_OTHER): Payer: Medicaid Other | Admitting: Dermatology

## 2019-08-30 ENCOUNTER — Other Ambulatory Visit: Payer: Self-pay

## 2019-08-30 ENCOUNTER — Other Ambulatory Visit: Payer: Self-pay | Admitting: Certified Nurse Midwife

## 2019-08-30 ENCOUNTER — Other Ambulatory Visit: Payer: Medicaid Other

## 2019-08-30 ENCOUNTER — Ambulatory Visit (INDEPENDENT_AMBULATORY_CARE_PROVIDER_SITE_OTHER): Payer: Medicaid Other | Admitting: Certified Nurse Midwife

## 2019-08-30 VITALS — BP 142/88 | Wt 180.0 lb

## 2019-08-30 DIAGNOSIS — D1800 Hemangioma unspecified site: Secondary | ICD-10-CM

## 2019-08-30 DIAGNOSIS — D229 Melanocytic nevi, unspecified: Secondary | ICD-10-CM | POA: Diagnosis not present

## 2019-08-30 DIAGNOSIS — L578 Other skin changes due to chronic exposure to nonionizing radiation: Secondary | ICD-10-CM

## 2019-08-30 DIAGNOSIS — Z23 Encounter for immunization: Secondary | ICD-10-CM

## 2019-08-30 DIAGNOSIS — O4443 Low lying placenta NOS or without hemorrhage, third trimester: Secondary | ICD-10-CM

## 2019-08-30 DIAGNOSIS — D225 Melanocytic nevi of trunk: Secondary | ICD-10-CM

## 2019-08-30 DIAGNOSIS — Z3A32 32 weeks gestation of pregnancy: Secondary | ICD-10-CM

## 2019-08-30 DIAGNOSIS — L710 Perioral dermatitis: Secondary | ICD-10-CM

## 2019-08-30 DIAGNOSIS — O4442 Low lying placenta NOS or without hemorrhage, second trimester: Secondary | ICD-10-CM

## 2019-08-30 DIAGNOSIS — O0990 Supervision of high risk pregnancy, unspecified, unspecified trimester: Secondary | ICD-10-CM

## 2019-08-30 DIAGNOSIS — Z1283 Encounter for screening for malignant neoplasm of skin: Secondary | ICD-10-CM | POA: Diagnosis not present

## 2019-08-30 DIAGNOSIS — O0992 Supervision of high risk pregnancy, unspecified, second trimester: Secondary | ICD-10-CM

## 2019-08-30 DIAGNOSIS — O26843 Uterine size-date discrepancy, third trimester: Secondary | ICD-10-CM

## 2019-08-30 DIAGNOSIS — Z131 Encounter for screening for diabetes mellitus: Secondary | ICD-10-CM

## 2019-08-30 DIAGNOSIS — L7211 Pilar cyst: Secondary | ICD-10-CM

## 2019-08-30 DIAGNOSIS — O163 Unspecified maternal hypertension, third trimester: Secondary | ICD-10-CM

## 2019-08-30 DIAGNOSIS — L814 Other melanin hyperpigmentation: Secondary | ICD-10-CM

## 2019-08-30 DIAGNOSIS — Z808 Family history of malignant neoplasm of other organs or systems: Secondary | ICD-10-CM

## 2019-08-30 DIAGNOSIS — L821 Other seborrheic keratosis: Secondary | ICD-10-CM

## 2019-08-30 DIAGNOSIS — O0993 Supervision of high risk pregnancy, unspecified, third trimester: Secondary | ICD-10-CM

## 2019-08-30 LAB — POCT URINALYSIS DIPSTICK OB: Glucose, UA: NEGATIVE

## 2019-08-30 MED ORDER — CLINDAMYCIN PHOSPHATE 1 % EX LOTN: TOPICAL_LOTION | CUTANEOUS | 3 refills | Status: DC

## 2019-08-30 NOTE — Progress Notes (Signed)
ROB at [facility]: Baby active.Just took Adderall dose prior to appointment. Denies headache, visual changes, CP, SOB, nausea and vomiting. Some RUQ pain related to baby movement. First visit in 6 weeks Having 28 week labs today. A pos blood type  Exam: BP initially 140/80, repeat 142/88. Trace proteinuria on dipstick. FH 28 cm FHT 125. TWG 22# (180#). Taking both Adderall and Effexor-both which could increase blood pressure  A/P: IUP at 32wk3d with elevated blood pressure  Asymptomatic of preeclampsia-but will get CMP and PC ratio along with her 28 week labs  DIscussed with patient what preeclampsia is and warning symptoms  RTN to office for blood pressure check on Thursday or Friday  Will call with lab results Size<dates-will get growth scan/ AFI/ placental location TDAP today Dalia Heading, CNM

## 2019-08-30 NOTE — Progress Notes (Signed)
C/o is not taking a prenatal vitamin; early last week had a fall d/t being stung 10 times by yellow jackets, landed right knee and elbow;  Took 3 benadryl and epson salt bath, hyperventilated; no bleeding or pink d/c; GFM.  Pt took her adderall late today.

## 2019-08-30 NOTE — Patient Instructions (Addendum)
Recommend daily broad spectrum sunscreen SPF 30+ to sun-exposed areas, reapply every 2 hours as needed. Call for new or changing lesions.  Pre-Operative Instructions  You are scheduled for a surgical procedure at [facility]. We recommend you read the following instructions. If you have any questions or concerns, please call the office at [phone].  1. Shower and wash the entire body with soap and water the day of your surgery paying special attention to cleansing at and around the planned surgery site.  2. Avoid aspirin or aspirin containing products at least fourteen (14) days prior to your surgical procedure and for at least one week (7 Days) after your surgical procedure. If you take aspirin on a regular basis for heart disease or history of stroke or for any other reason, we may recommend you continue taking aspirin but please notify us if you take this on a regular basis. Aspirin can cause more bleeding to occur during surgery as well as prolonged bleeding and bruising after surgery.   3. Avoid other nonsteroidal pain medications at least one week prior to surgery and at least one week prior to your surgery. These include medications such as Ibuprofen (Motrin, Advil and Nuprin), Naprosyn, Voltaren, Relafen, etc. If medications are used for therapeutic reasons, please inform us as they can cause increased bleeding or prolonged bleeding during and bruising after surgical procedures.   4. Please advice us if you are taking any "blood thinner" medications such as Coumadin or Dipyridamole or Plavix or similar medications. These cause increased bleeding and prolonged bleeding during and bruising after surgical procedures. We may have to consider discontinuing these medications briefly prior to and shortly after your surgery, if safe to do so.   5. Please inform us of all medications you are currently taking. All medications that are taken regularly should be taken the day of surgery as you  always do. Nevertheless, we need to be informed of what medications you are taking prior to surgery to whether they will affect the procedure or cause any complications.   6. Please inform us of any medication allergies. Also inform us of whether you have allergies to Latex or rubber products or whether you have had any adverse reaction to Lidocaine or Epinephrine.  7. Please inform us of any prosthetic or artificial body parts such as artificial heart valve, joint replacements, etc., or similar condition that might require preoperative antibiotics.   8. We recommend avoidance of alcohol at least two weeks prior to surgery and continued avoidence for at least two weeks after surgery.   9. We recommend discontinuation of tobacco smoking at least two weeks prior to surgery and continued abstinence for at least two weeks after surgery.  10. Do not plan strenuous exercise, strenuous work or strenuous lifting for approximately four weeks after your surgery.   11. We request if you are unable to make your scheduled surgical appointment, please call us at least a week in advance or as soon as you are aware of a problem sot aht we can cancel or reschedule you.   12. You MAKE TAKE TYLENOL (acetaminophen) for pain as it is not a blood thinner.   13. PLEASE PLAN TO BE IN TOWN FOR TWO WEEKS FOLLOWING SURGERY, THIS IS IMPORTANT SO YOU CAN BE CHECKED FOR DRESSING CHANGES, SUTURE REMOVAL AND TO MONITOR FOR POSSIBLE COMPLICATIONS.  

## 2019-08-30 NOTE — Progress Notes (Signed)
New Patient Visit  Subjective  Jillian Phillips is a 39 y.o. female who presents for the following: New Patient (Initial Visit).  Pt is [redacted] weeks pregnant.  Patient presents today as a new patient, wants a TBSE today has a few areas of concern on face, scalp, back and chest. Patient fell at beginning of week and hit a yellow jacket nest and was stung over 10 times. Patient is [redacted] weeks pregnant. Mother has h/o Bcc.  Patient using Metro gel on face per pcp for facial rash. The patient presents for Total-Body Skin Exam (TBSE) for skin cancer screening and mole check.  The following portions of the chart were reviewed this encounter and updated as appropriate:  Tobacco  Allergies  Meds  Problems  Med Hx  Surg Hx  Fam Hx     Review of Systems:  No other skin or systemic complaints except as noted in HPI or Assessment and Plan.  Objective  Well appearing patient in no apparent distress; mood and affect are within normal limits.  A full examination was performed including scalp, head, eyes, ears, nose, lips, neck, chest, axillae, abdomen, back, buttocks, bilateral upper extremities, bilateral lower extremities, hands, feet, fingers, toes, fingernails, and toenails. All findings within normal limits unless otherwise noted below.  Objective  Left Frontal Scalp, Mid Frontal Scalp, Right Frontal Scalp: Subcutaneous nodule at scalp.   Objective  Peri oral: Mild pink patches perioral area  Objective  Left Infra medial breast: 0.6 cm med. Brown macule  Images       Assessment & Plan    Pilar cyst (3) Left Frontal Scalp; Right Frontal Scalp; Mid Frontal Scalp  Cyst with symptoms and/or recent change.  Discussed surgical excision to remove, including resulting scar and possible recurrence.  Patient will schedule for surgery. Pre-op information given.  Will proceed Pending OB/GYN clearance - will use local anesthesia - Lidocaine with epinephrine - approximately 3-5 cc each  lesion.  Will only do one lesion at a time - will need 3 surgery days scheduled  Perioral dermatitis Peri oral /face  Start Clindamycin lotion once daily Pending OB/GYN clearance to use this  clindamycin (CLEOCIN-T) 1 % lotion - Peri oral  Nevus Left Infra medial breast Irregular nevus r/o dysplasia  Recommend shave removal  pending OB/GYN clearance to proceed while pregnant (will use small amount 2cc or less of Lidocaine with Epinephrine local anesthesia)    Lentigines - Scattered tan macules - Discussed due to sun exposure - Benign, observe - Call for any changes  Seborrheic Keratoses - Stuck-on, waxy, tan-brown papules and plaques  - Discussed benign etiology and prognosis. - Observe - Call for any changes  Melanocytic Nevi - Tan-brown and/or pink-flesh-colored symmetric macules and papules - Benign appearing on exam today - Observation - Call clinic for new or changing moles - Recommend daily use of broad spectrum spf 30+ sunscreen to sun-exposed areas.   Hemangiomas - Red papules - Discussed benign nature - Observe - Call for any changes  Actinic Damage - diffuse scaly erythematous macules with underlying dyspigmentation - Recommend daily broad spectrum sunscreen SPF 30+ to sun-exposed areas, reapply every 2 hours as needed.  - Call for new or changing lesions.  Skin cancer screening performed today.  No follow-ups on file.  Marene Lenz, CMA, am acting as scribe for Sarina Ser, MD .  Documentation: I have reviewed the above documentation for accuracy and completeness, and I agree with the above.  Sarina Ser, MD

## 2019-08-31 ENCOUNTER — Telehealth: Payer: Self-pay

## 2019-08-31 ENCOUNTER — Encounter: Payer: Self-pay | Admitting: Dermatology

## 2019-08-31 LAB — 28 WEEK RH+PANEL
Basophils Absolute: 0 10*3/uL (ref 0.0–0.2)
Basos: 0 %
EOS (ABSOLUTE): 0.1 10*3/uL (ref 0.0–0.4)
Eos: 1 %
Gestational Diabetes Screen: 84 mg/dL (ref 65–139)
HIV Screen 4th Generation wRfx: NONREACTIVE
Hematocrit: 29.6 % — ABNORMAL LOW (ref 34.0–46.6)
Hemoglobin: 10.1 g/dL — ABNORMAL LOW (ref 11.1–15.9)
Immature Grans (Abs): 0.2 10*3/uL — ABNORMAL HIGH (ref 0.0–0.1)
Immature Granulocytes: 1 %
Lymphocytes Absolute: 2.2 10*3/uL (ref 0.7–3.1)
Lymphs: 15 %
MCH: 28.7 pg (ref 26.6–33.0)
MCHC: 34.1 g/dL (ref 31.5–35.7)
MCV: 84 fL (ref 79–97)
Monocytes Absolute: 1.3 10*3/uL — ABNORMAL HIGH (ref 0.1–0.9)
Monocytes: 9 %
Neutrophils Absolute: 11 10*3/uL — ABNORMAL HIGH (ref 1.4–7.0)
Neutrophils: 74 %
Platelets: 244 10*3/uL (ref 150–450)
RBC: 3.52 x10E6/uL — ABNORMAL LOW (ref 3.77–5.28)
RDW: 12.4 % (ref 11.7–15.4)
RPR Ser Ql: NONREACTIVE
WBC: 14.8 10*3/uL — ABNORMAL HIGH (ref 3.4–10.8)

## 2019-08-31 LAB — CMP14+EGFR
ALT: 24 IU/L (ref 0–32)
AST: 26 IU/L (ref 0–40)
Albumin/Globulin Ratio: 1.1 — ABNORMAL LOW (ref 1.2–2.2)
Albumin: 3.4 g/dL — ABNORMAL LOW (ref 3.8–4.8)
Alkaline Phosphatase: 165 IU/L — ABNORMAL HIGH (ref 48–121)
BUN/Creatinine Ratio: 14 (ref 9–23)
BUN: 8 mg/dL (ref 6–20)
Bilirubin Total: <0.2 mg/dL (ref 0.0–1.2)
CO2: 20 mmol/L (ref 20–29)
Calcium: 9.3 mg/dL (ref 8.7–10.2)
Chloride: 101 mmol/L (ref 96–106)
Creatinine, Ser: 0.58 mg/dL (ref 0.57–1.00)
GFR calc Af Amer: 135 mL/min/{1.73_m2} (ref >59)
GFR calc non Af Amer: 117 mL/min/{1.73_m2} (ref >59)
Globulin, Total: 3.2 g/dL (ref 1.5–4.5)
Glucose: 88 mg/dL (ref 65–99)
Potassium: 4.6 mmol/L (ref 3.5–5.2)
Sodium: 133 mmol/L — ABNORMAL LOW (ref 134–144)
Total Protein: 6.6 g/dL (ref 6.0–8.5)

## 2019-08-31 LAB — PROTEIN / CREATININE RATIO, URINE
Creatinine, Urine: 128.7 mg/dL
Protein, Ur: 22.6 mg/dL
Protein/Creat Ratio: 176 mg/g creat (ref 0–200)

## 2019-08-31 NOTE — Telephone Encounter (Signed)
Patient called with lab results:  Results for orders placed or performed in visit on 08/30/19 (from the past 72 hour(s))  CMP14+EGFR     Status: Abnormal   Collection Time: 08/30/19  3:51 PM  Result Value Ref Range   Glucose 88 65 - 99 mg/dL   BUN 8 6 - 20 mg/dL   Creatinine, Ser 0.58 0.57 - 1.00 mg/dL   GFR calc non Af Amer 117 >59 mL/min/1.73   GFR calc Af Amer 135 >59 mL/min/1.73    Comment: **[facility] currently reports eGFR in compliance with the current**   recommendations of the [employer]. [facility] will   update reporting as new guidelines are published from the NKF-ASN   Task force.    BUN/Creatinine Ratio 14 9 - 23   Sodium 133 (L) 134 - 144 mmol/L   Potassium 4.6 3.5 - 5.2 mmol/L   Chloride 101 96 - 106 mmol/L   CO2 20 20 - 29 mmol/L   Calcium 9.3 8.7 - 10.2 mg/dL   Total Protein 6.6 6.0 - 8.5 g/dL   Albumin 3.4 (L) 3.8 - 4.8 g/dL   Globulin, Total 3.2 1.5 - 4.5 g/dL   Albumin/Globulin Ratio 1.1 (L) 1.2 - 2.2   Bilirubin Total <0.2 0.0 - 1.2 mg/dL   Alkaline Phosphatase 165 (H) 48 - 121 IU/L   AST 26 0 - 40 IU/L   ALT 24 0 - 32 IU/L   P/C ratio was 176 mgm protein CBC    Component Value Date/Time   WBC 14.8 (H) 08/30/2019 1531   WBC 9.6 04/09/2019 1505   RBC 3.52 (L) 08/30/2019 1531   RBC 4.33 04/09/2019 1505   HGB 10.1 (L) 08/30/2019 1531   HCT 29.6 (L) 08/30/2019 1531   PLT 244 08/30/2019 1531   MCV 84 08/30/2019 1531   MCV 93 04/24/2011 1120   MCH 28.7 08/30/2019 1531   MCH 29.1 04/09/2019 1505   MCHC 34.1 08/30/2019 1531   MCHC 33.2 04/09/2019 1505   RDW 12.4 08/30/2019 1531   RDW 12.5 04/24/2011 1120   LYMPHSABS 2.2 08/30/2019 1531   LYMPHSABS 1.6 03/15/2011 0848   MONOABS 0.5 03/15/2011 0848   EOSABS 0.1 08/30/2019 1531   EOSABS 0.0 03/15/2011 0848   BASOSABS 0.0 08/30/2019 1531   BASOSABS 0.0 03/15/2011 0848   1 hour glucola was 84  A: Normal preeclampsia labs Anemia  P: Encouraged to take prenatal vitamin with iron RTO in 2  days for blood pressure check.   Dalia Heading, CNM

## 2019-08-31 NOTE — Telephone Encounter (Signed)
Pt Calling for lab results to see if she has Preeclampsia

## 2019-09-02 ENCOUNTER — Ambulatory Visit (INDEPENDENT_AMBULATORY_CARE_PROVIDER_SITE_OTHER): Payer: Medicaid Other | Admitting: Advanced Practice Midwife

## 2019-09-02 ENCOUNTER — Other Ambulatory Visit: Payer: Self-pay

## 2019-09-02 ENCOUNTER — Encounter: Payer: Self-pay | Admitting: Advanced Practice Midwife

## 2019-09-02 ENCOUNTER — Ambulatory Visit (INDEPENDENT_AMBULATORY_CARE_PROVIDER_SITE_OTHER): Payer: Medicaid Other

## 2019-09-02 ENCOUNTER — Other Ambulatory Visit: Payer: Self-pay | Admitting: Advanced Practice Midwife

## 2019-09-02 VITALS — BP 160/88

## 2019-09-02 DIAGNOSIS — O4443 Low lying placenta NOS or without hemorrhage, third trimester: Secondary | ICD-10-CM

## 2019-09-02 DIAGNOSIS — O163 Unspecified maternal hypertension, third trimester: Secondary | ICD-10-CM

## 2019-09-02 DIAGNOSIS — Z3A32 32 weeks gestation of pregnancy: Secondary | ICD-10-CM

## 2019-09-02 DIAGNOSIS — O365930 Maternal care for other known or suspected poor fetal growth, third trimester, not applicable or unspecified: Secondary | ICD-10-CM | POA: Diagnosis not present

## 2019-09-02 DIAGNOSIS — O09523 Supervision of elderly multigravida, third trimester: Secondary | ICD-10-CM

## 2019-09-02 DIAGNOSIS — O26843 Uterine size-date discrepancy, third trimester: Secondary | ICD-10-CM | POA: Diagnosis not present

## 2019-09-02 DIAGNOSIS — O4442 Low lying placenta NOS or without hemorrhage, second trimester: Secondary | ICD-10-CM

## 2019-09-02 DIAGNOSIS — O0993 Supervision of high risk pregnancy, unspecified, third trimester: Secondary | ICD-10-CM

## 2019-09-02 NOTE — Progress Notes (Signed)
Routine Prenatal Care Visit  Subjective  Jillian Phillips is a 39 y.o. G3P2002 at [redacted]w[redacted]d being seen today for ongoing prenatal care.  She is currently monitored for the following issues for this high-risk pregnancy and has History of prior pregnancy with IUGR newborn; History of hypothyroidism; History of substance abuse ([place]); Chronic viral hepatitis complicating pregnancy ([place]); Antepartum multigravida of advanced maternal age; Supervision of high risk pregnancy, antepartum; Genital herpes affecting pregnancy, antepartum; GBS bacteriuria; Low-lying placenta in second trimester; Abdominal pain in pregnancy, antepartum; and Hypertension affecting pregnancy, third trimester on their problem list.  ----------------------------------------------------------------------------------- Patient reports she took her adderall this morning. She has ongoing right upper quadrant pain that she thinks is baby position. She denies headache or visual changes. We discussed elevated blood pressure today/follow up from 3 days ago and recommendation for [facility] eval at [facility].   Contractions: Not present. Vag. Bleeding: None.  Movement: Present. Leaking Fluid denies.  ----------------------------------------------------------------------------------- The following portions of the patient's history were reviewed and updated as appropriate: allergies, current medications, past family history, past medical history, past social history, past surgical history and problem list. Problem list updated.  Objective  Blood pressure (!) 160/88, last menstrual period 01/15/2019. BP recheck: 154/84 Pregravid weight 158 lb (71.7 kg) Total Weight Gain 22 lb (9.979 kg) Urinalysis: Urine Protein    Urine Glucose    Fetal Status: Fetal Heart Rate (bpm): 134 Fundal Height: 32 cm Movement: Present  Presentation: Vertex     Patient Name: Jillian Phillips DOB: 02/17/1980 MRN: [id number]   ULTRASOUND REPORT  Location: [facility] Date of  Service: 09/02/2019   Indications:growth/afi Findings:  Nelda [place]se[place] intrauterine pregnancy is visualized with FHR at 134 BPM. Biometrics give an (U/S) Gestational age of [redacted]w[redacted]d and an (U/S) EDD of 10/25/2019; this correlates with the clinically established Estimated Date of Delivery: 10/22/19.  Fetal presentation is Cephalic.  Placenta: posterior. Grade: 2 AFI: 13.7 cm  Growth percentile is 29.4%.  AC percentile is 12.9%. EFW: 1846 g ( 4 lb 1 oz )   BPP Scoring: Movement: 2/2  Tone: 2/2  Breathing: 2/2  AFI: 2/2  Umbilical Artery Dopplers were performed due to [facility] 40.9% Systolic and Diastolic blood flow in each umbilical artery appear normal and without reversal or absence of diastolic flow.   The maximum S/D ratio is 3.6. Slightly high. The other two are normal at 2.7 and 2.6  According to perinatology.com, this ratio is normal for this gestational age.  Gweneth Dimitri, RT Impression: 1. [redacted]w[redacted]d Viable Singleton Intrauterine pregnancy previously established criteria. 2. Growth is 29.4 %ile.  AFI is 13.7 cm.  3 8/8 on BPP 4. One umbilical artery doppler is slightly high. The others are normal.   Recommendations: 1.Clinical correlation with the patient's History and Physical Exam.  Gweneth Dimitri, RT     General:  Alert, oriented and cooperative. Patient is in no acute distress.  Skin: Skin is warm and dry. No rash noted.   Cardiovascular: Normal heart rate noted  Respiratory: Normal respiratory effort, no problems with respiration noted  Abdomen: Soft, gravid, appropriate for gestational age. Pain/Pressure: Present     Pelvic:  Cervical exam deferred        Extremities: Normal range of motion.  Edema: None  Mental Status: Normal mood and affect. Normal behavior. Normal judgment and thought content.   Assessment   39 y.o. G3P2002 at [redacted]w[redacted]d by  10/22/2019, by Last Menstrual Period presenting for routine prenatal visit  Plan   Pregnancy#3  Problems (from  01/15/19 to present)    Problem Noted Resolved   Hypertension affecting pregnancy, third trimester 09/02/2019 by Rod Can, CNM No   GBS bacteriuria 05/04/2019 by Malachy Mood, MD No   Overview Signed 05/31/2019  8:56 AM by Dalia Heading, CNM    Treat intrapartum      History of prior pregnancy with IUGR newborn 04/27/2019 by Malachy Mood, MD No   History of hypothyroidism 04/27/2019 by Malachy Mood, MD No   Overview Signed 05/07/2019  1:40 PM by Malachy Mood, MD    Not on meds prior to pregnancy, normal thyroid function labs in 1st trimester      History of substance abuse ([place]) 04/27/2019 by Malachy Mood, MD No   Overview Signed 05/31/2019  8:20 AM by Imagene Riches, CNM    Urine drug screen at initial Ob visit: + THC, + amphetamines (Adderall).      Chronic viral hepatitis complicating pregnancy ([place]) 04/27/2019 by Malachy Mood, MD No   Overview Signed 05/07/2019  1:39 PM by Malachy Mood, MD    History hepatitis C with negative viral load on check in first trimester      Antepartum multigravida of advanced maternal age 59/16/2021 by Malachy Mood, MD No    Go to [facility] for [facility] eval- Dr Gilman Schmidt and L&D notified   Preterm labor symptoms and general obstetric precautions including but not limited to vaginal bleeding, contractions, leaking of fluid and fetal movement were reviewed in detail with the patient. Please refer to After Visit Summary for other counseling recommendations.   Return in about 2 weeks (around 09/16/2019) for rob.  Rod Can, CNM 09/02/2019 4:31 PM

## 2019-09-02 NOTE — Progress Notes (Signed)
BP check and u/s today

## 2019-09-02 NOTE — Patient Instructions (Signed)

## 2019-09-20 ENCOUNTER — Other Ambulatory Visit: Payer: Self-pay

## 2019-09-20 ENCOUNTER — Encounter: Payer: Self-pay | Admitting: Obstetrics and Gynecology

## 2019-09-20 ENCOUNTER — Encounter: Payer: Medicaid Other | Admitting: Obstetrics and Gynecology

## 2019-09-21 ENCOUNTER — Encounter: Payer: Self-pay | Admitting: Obstetrics & Gynecology

## 2019-09-21 ENCOUNTER — Ambulatory Visit (INDEPENDENT_AMBULATORY_CARE_PROVIDER_SITE_OTHER): Payer: Medicaid Other | Admitting: Obstetrics & Gynecology

## 2019-09-21 VITALS — BP 120/72 | Wt 188.8 lb

## 2019-09-21 DIAGNOSIS — O09523 Supervision of elderly multigravida, third trimester: Secondary | ICD-10-CM

## 2019-09-21 DIAGNOSIS — O163 Unspecified maternal hypertension, third trimester: Secondary | ICD-10-CM

## 2019-09-21 DIAGNOSIS — O0990 Supervision of high risk pregnancy, unspecified, unspecified trimester: Secondary | ICD-10-CM

## 2019-09-21 DIAGNOSIS — Z3A35 35 weeks gestation of pregnancy: Secondary | ICD-10-CM

## 2019-09-21 DIAGNOSIS — O0993 Supervision of high risk pregnancy, unspecified, third trimester: Secondary | ICD-10-CM

## 2019-09-21 NOTE — Progress Notes (Signed)
  Subjective  Fetal Movement? yes Contractions? no Leaking Fluid? no Vaginal Bleeding? no BP normal today and over the past week.  Has been off of week for 2 weeks, now working 3 days a week if she can Objective  BP 120/72   Wt 188 lb 12.8 oz (85.6 kg)   LMP 01/15/2019 (Approximate)   BMI 28.71 kg/m  General: NAD Pumonary: no increased work of breathing Abdomen: gravid, non-tender Extremities: no edema Psychiatric: mood appropriate, affect full  Assessment  39 y.o. G3P2002 at [redacted]w[redacted]d by  10/22/2019, by Last Menstrual Period presenting for routine prenatal visit  Plan   Problem List Items Addressed This Visit      Cardiovascular and Mediastinum   Hypertension affecting pregnancy, third trimester     Other   Supervision of high risk pregnancy, antepartum    Other Visit Diagnoses    [redacted] weeks gestation of pregnancy    -  Primary   Relevant Orders   POC Urinalysis Dipstick OB   Supervision of high risk pregnancy in third trimester       Multigravida of advanced maternal age in third trimester          Pregnancy#3 Problems (from 01/15/19 to present)    Problem Noted Resolved   Hypertension affecting pregnancy, third trimester 09/02/2019 by Rod Can, CNM No   GBS bacteriuria 05/04/2019 by Malachy Mood, MD No   Overview Signed 05/31/2019  8:56 AM by Dalia Heading, CNM    Treat intrapartum      History of prior pregnancy with IUGR newborn 04/27/2019 by Malachy Mood, MD No   History of hypothyroidism 04/27/2019 by Malachy Mood, MD No   Overview Signed 05/07/2019  1:40 PM by Malachy Mood, MD    Not on meds prior to pregnancy, normal thyroid function labs in 1st trimester      History of substance abuse ([place]) 04/27/2019 by Malachy Mood, MD No   Overview Signed 05/31/2019  8:20 AM by Imagene Riches, CNM    Urine drug screen at initial Ob visit: + THC, + amphetamines (Adderall).      Chronic viral hepatitis complicating pregnancy ([place])  04/27/2019 by Malachy Mood, MD No   Overview Signed 05/07/2019  1:39 PM by Malachy Mood, MD    History hepatitis C with negative viral load on check in first trimester      Antepartum multigravida of advanced maternal age 81/16/2021 by Malachy Mood, MD No     GBS nv, [place] 2 weeks, BP monitoring, IUD discussed as BC option  Barnett Applebaum, MD, [facility], [facility] 09/21/2019  3:46 PM

## 2019-09-21 NOTE — Patient Instructions (Signed)

## 2019-09-27 ENCOUNTER — Inpatient Hospital Stay
Admission: EM | Admit: 2019-09-27 | Discharge: 2019-09-30 | DRG: 806 | Disposition: A | Discharge status: Home / Self Care | Payer: Medicaid Other | Attending: Certified Nurse Midwife | Admitting: Certified Nurse Midwife

## 2019-09-27 ENCOUNTER — Telehealth: Payer: Self-pay

## 2019-09-27 ENCOUNTER — Other Ambulatory Visit: Payer: Self-pay

## 2019-09-27 ENCOUNTER — Encounter: Payer: Self-pay | Admitting: Obstetrics and Gynecology

## 2019-09-27 DIAGNOSIS — O9842 Viral hepatitis complicating childbirth: Secondary | ICD-10-CM | POA: Diagnosis present

## 2019-09-27 DIAGNOSIS — O99344 Other mental disorders complicating childbirth: Secondary | ICD-10-CM | POA: Diagnosis present

## 2019-09-27 DIAGNOSIS — D62 Acute posthemorrhagic anemia: Secondary | ICD-10-CM | POA: Diagnosis not present

## 2019-09-27 DIAGNOSIS — O99334 Smoking (tobacco) complicating childbirth: Secondary | ICD-10-CM | POA: Diagnosis present

## 2019-09-27 DIAGNOSIS — Z20822 Contact with and (suspected) exposure to covid-19: Secondary | ICD-10-CM | POA: Diagnosis present

## 2019-09-27 DIAGNOSIS — A6009 Herpesviral infection of other urogenital tract: Secondary | ICD-10-CM | POA: Diagnosis present

## 2019-09-27 DIAGNOSIS — O98419 Viral hepatitis complicating pregnancy, unspecified trimester: Secondary | ICD-10-CM

## 2019-09-27 DIAGNOSIS — F17210 Nicotine dependence, cigarettes, uncomplicated: Secondary | ICD-10-CM | POA: Diagnosis present

## 2019-09-27 DIAGNOSIS — Z8759 Personal history of other complications of pregnancy, childbirth and the puerperium: Secondary | ICD-10-CM

## 2019-09-27 DIAGNOSIS — B182 Chronic viral hepatitis C: Secondary | ICD-10-CM | POA: Diagnosis present

## 2019-09-27 DIAGNOSIS — O163 Unspecified maternal hypertension, third trimester: Secondary | ICD-10-CM | POA: Insufficient documentation

## 2019-09-27 DIAGNOSIS — O09523 Supervision of elderly multigravida, third trimester: Secondary | ICD-10-CM | POA: Diagnosis not present

## 2019-09-27 DIAGNOSIS — F1911 Other psychoactive substance abuse, in remission: Secondary | ICD-10-CM

## 2019-09-27 DIAGNOSIS — F329 Major depressive disorder, single episode, unspecified: Secondary | ICD-10-CM | POA: Diagnosis present

## 2019-09-27 DIAGNOSIS — O0990 Supervision of high risk pregnancy, unspecified, unspecified trimester: Secondary | ICD-10-CM

## 2019-09-27 DIAGNOSIS — O9081 Anemia of the puerperium: Secondary | ICD-10-CM | POA: Diagnosis not present

## 2019-09-27 DIAGNOSIS — O119 Pre-existing hypertension with pre-eclampsia, unspecified trimester: Secondary | ICD-10-CM | POA: Diagnosis present

## 2019-09-27 DIAGNOSIS — B189 Chronic viral hepatitis, unspecified: Secondary | ICD-10-CM | POA: Diagnosis present

## 2019-09-27 DIAGNOSIS — O1002 Pre-existing essential hypertension complicating childbirth: Secondary | ICD-10-CM | POA: Diagnosis present

## 2019-09-27 DIAGNOSIS — O98319 Other infections with a predominantly sexual mode of transmission complicating pregnancy, unspecified trimester: Secondary | ICD-10-CM | POA: Diagnosis present

## 2019-09-27 DIAGNOSIS — Z3A36 36 weeks gestation of pregnancy: Secondary | ICD-10-CM

## 2019-09-27 DIAGNOSIS — R030 Elevated blood-pressure reading, without diagnosis of hypertension: Secondary | ICD-10-CM | POA: Diagnosis present

## 2019-09-27 DIAGNOSIS — R8271 Bacteriuria: Secondary | ICD-10-CM

## 2019-09-27 DIAGNOSIS — O09529 Supervision of elderly multigravida, unspecified trimester: Secondary | ICD-10-CM

## 2019-09-27 DIAGNOSIS — F419 Anxiety disorder, unspecified: Secondary | ICD-10-CM | POA: Diagnosis present

## 2019-09-27 DIAGNOSIS — O114 Pre-existing hypertension with pre-eclampsia, complicating childbirth: Principal | ICD-10-CM | POA: Diagnosis present

## 2019-09-27 DIAGNOSIS — Z8639 Personal history of other endocrine, nutritional and metabolic disease: Secondary | ICD-10-CM

## 2019-09-27 HISTORY — DX: Personal history of other specified conditions: Z87.898

## 2019-09-27 HISTORY — DX: Chronic viral hepatitis C: B18.2

## 2019-09-27 HISTORY — DX: Cannabis use, unspecified, uncomplicated: F12.90

## 2019-09-27 HISTORY — DX: Herpesviral infection of urogenital system, unspecified: A60.00

## 2019-09-27 HISTORY — DX: Other specified behavioral and emotional disorders with onset usually occurring in childhood and adolescence: F98.8

## 2019-09-27 HISTORY — DX: Depression, unspecified: F32.A

## 2019-09-27 LAB — COMPREHENSIVE METABOLIC PANEL
ALT: 22 U/L (ref 0–44)
AST: 29 U/L (ref 15–41)
Albumin: 2.7 g/dL — ABNORMAL LOW (ref 3.5–5.0)
Alkaline Phosphatase: 192 U/L — ABNORMAL HIGH (ref 38–126)
Anion gap: 10 (ref 5–15)
BUN: 12 mg/dL (ref 6–20)
CO2: 23 mmol/L (ref 22–32)
Calcium: 9 mg/dL (ref 8.9–10.3)
Chloride: 102 mmol/L (ref 98–111)
Creatinine, Ser: 0.6 mg/dL (ref 0.44–1.00)
GFR calc Af Amer: >60 mL/min (ref >60)
GFR calc non Af Amer: >60 mL/min (ref >60)
Glucose, Bld: 104 mg/dL — ABNORMAL HIGH (ref 70–99)
Potassium: 3.9 mmol/L (ref 3.5–5.1)
Sodium: 135 mmol/L (ref 135–145)
Total Bilirubin: 0.5 mg/dL (ref 0.3–1.2)
Total Protein: 6.5 g/dL (ref 6.5–8.1)

## 2019-09-27 LAB — CBC
HCT: 27.2 % — ABNORMAL LOW (ref 36.0–46.0)
HCT: 28.2 % — ABNORMAL LOW (ref 36.0–46.0)
Hemoglobin: 8.6 g/dL — ABNORMAL LOW (ref 12.0–15.0)
Hemoglobin: 9 g/dL — ABNORMAL LOW (ref 12.0–15.0)
MCH: 27.1 pg (ref 26.0–34.0)
MCH: 27.3 pg (ref 26.0–34.0)
MCHC: 31.6 g/dL (ref 30.0–36.0)
MCHC: 31.9 g/dL (ref 30.0–36.0)
MCV: 85.8 fL (ref 80.0–100.0)
MCV: 85.5 fL (ref 80.0–100.0)
Platelets: 209 10*3/uL (ref 150–400)
Platelets: 222 10*3/uL (ref 150–400)
RBC: 3.17 MIL/uL — ABNORMAL LOW (ref 3.87–5.11)
RBC: 3.3 MIL/uL — ABNORMAL LOW (ref 3.87–5.11)
RDW: 13.6 % (ref 11.5–15.5)
RDW: 13.7 % (ref 11.5–15.5)
WBC: 12.8 10*3/uL — ABNORMAL HIGH (ref 4.0–10.5)
WBC: 15.8 10*3/uL — ABNORMAL HIGH (ref 4.0–10.5)
nRBC: 0 % (ref 0.0–0.2)
nRBC: 0.1 % (ref 0.0–0.2)

## 2019-09-27 LAB — SARS CORONAVIRUS 2 BY RT PCR (HOSPITAL ORDER, PERFORMED IN ~~LOC~~ HOSPITAL LAB): SARS Coronavirus 2: NEGATIVE

## 2019-09-27 LAB — GROUP B STREP BY PCR: Group B strep by PCR: POSITIVE — AB

## 2019-09-27 LAB — TYPE AND SCREEN
ABO/RH(D): A POS
Antibody Screen: NEGATIVE

## 2019-09-27 LAB — URINE DRUG SCREEN, QUALITATIVE (ARMC ONLY)
Amphetamines, Ur Screen: POSITIVE — AB
Barbiturates, Ur Screen: NOT DETECTED
Benzodiazepine, Ur Scrn: NOT DETECTED
Cannabinoid 50 Ng, Ur ~~LOC~~: POSITIVE — AB
Cocaine Metabolite,Ur ~~LOC~~: NOT DETECTED
MDMA (Ecstasy)Ur Screen: NOT DETECTED
Methadone Scn, Ur: NOT DETECTED
Opiate, Ur Screen: NOT DETECTED
Phencyclidine (PCP) Ur S: NOT DETECTED
Tricyclic, Ur Screen: NOT DETECTED

## 2019-09-27 LAB — PROTEIN / CREATININE RATIO, URINE
Creatinine, Urine: 210 mg/dL
Protein Creatinine Ratio: 0.24 mg/mg{Cre} — ABNORMAL HIGH (ref 0.00–0.15)
Total Protein, Urine: 50 mg/dL

## 2019-09-27 MED ORDER — MISOPROSTOL 200 MCG PO TABS
ORAL_TABLET | ORAL | Status: AC
  Administered 2019-09-28: 800 ug via RECTAL
  Filled 2019-09-27: qty 4

## 2019-09-27 MED ORDER — ACETAMINOPHEN 500 MG PO TABS
1000.0000 mg | ORAL_TABLET | Freq: Four times a day (QID) | ORAL | Status: DC | PRN
  Administered 2019-09-27: 1000 mg via ORAL

## 2019-09-27 MED ORDER — MAGNESIUM SULFATE BOLUS VIA INFUSION
4.0000 g | Freq: Once | INTRAVENOUS | Status: AC
  Administered 2019-09-27: 4 g via INTRAVENOUS
  Filled 2019-09-27: qty 1000

## 2019-09-27 MED ORDER — LABETALOL HCL 100 MG PO TABS
300.0000 mg | ORAL_TABLET | Freq: Two times a day (BID) | ORAL | Status: DC
  Filled 2019-09-27: qty 3

## 2019-09-27 MED ORDER — LACTATED RINGERS IV SOLN: 500.0000 mL | INTRAVENOUS | Status: DC | PRN

## 2019-09-27 MED ORDER — ACETAMINOPHEN 500 MG PO TABS
ORAL_TABLET | ORAL | Status: AC
  Filled 2019-09-27: qty 2

## 2019-09-27 MED ORDER — LABETALOL HCL 5 MG/ML IV SOLN: 80.0000 mg | INTRAVENOUS | Status: DC | PRN

## 2019-09-27 MED ORDER — SODIUM CHLORIDE 0.9 % IV SOLN
5.0000 10*6.[IU] | Freq: Once | INTRAVENOUS | Status: AC
  Administered 2019-09-27: 5 10*6.[IU] via INTRAVENOUS
  Filled 2019-09-27: qty 5

## 2019-09-27 MED ORDER — OXYTOCIN BOLUS FROM INFUSION
333.0000 mL | Freq: Once | INTRAVENOUS | Status: AC
  Administered 2019-09-28: 333 mL via INTRAVENOUS

## 2019-09-27 MED ORDER — OXYTOCIN 10 UNIT/ML IJ SOLN
INTRAMUSCULAR | Status: AC
  Filled 2019-09-27: qty 2

## 2019-09-27 MED ORDER — BETAMETHASONE SOD PHOS & ACET 6 (3-3) MG/ML IJ SUSP
12.0000 mg | Freq: Once | INTRAMUSCULAR | Status: AC
  Administered 2019-09-27: 12 mg via INTRAMUSCULAR
  Filled 2019-09-27: qty 5

## 2019-09-27 MED ORDER — FENTANYL CITRATE (PF) 100 MCG/2ML IJ SOLN: 50.0000 ug | INTRAMUSCULAR | Status: DC | PRN

## 2019-09-27 MED ORDER — VALACYCLOVIR HCL 500 MG PO TABS
500.0000 mg | ORAL_TABLET | Freq: Two times a day (BID) | ORAL | Status: DC
  Administered 2019-09-27 – 2019-09-28 (×2): 500 mg via ORAL
  Filled 2019-09-27: qty 1

## 2019-09-27 MED ORDER — LABETALOL HCL 5 MG/ML IV SOLN
20.0000 mg | INTRAVENOUS | Status: DC | PRN
  Administered 2019-09-27: 20 mg via INTRAVENOUS
  Filled 2019-09-27 (×2): qty 4

## 2019-09-27 MED ORDER — LACTATED RINGERS IV SOLN: INTRAVENOUS | Status: DC

## 2019-09-27 MED ORDER — AMMONIA AROMATIC IN INHA
RESPIRATORY_TRACT | Status: AC
  Filled 2019-09-27: qty 10

## 2019-09-27 MED ORDER — HYDRALAZINE HCL 20 MG/ML IJ SOLN: 10.0000 mg | INTRAMUSCULAR | Status: DC | PRN

## 2019-09-27 MED ORDER — OXYTOCIN-SODIUM CHLORIDE 30-0.9 UT/500ML-% IV SOLN
2.5000 [IU]/h | INTRAVENOUS | Status: DC
  Administered 2019-09-28: 2.5 [IU]/h via INTRAVENOUS
  Filled 2019-09-27: qty 1000

## 2019-09-27 MED ORDER — CALCIUM GLUCONATE 10 % IV SOLN
INTRAVENOUS | Status: AC
  Filled 2019-09-27: qty 10

## 2019-09-27 MED ORDER — ONDANSETRON HCL 4 MG/2ML IJ SOLN
4.0000 mg | Freq: Four times a day (QID) | INTRAMUSCULAR | Status: DC | PRN
  Administered 2019-09-27: 4 mg via INTRAVENOUS
  Filled 2019-09-27: qty 2

## 2019-09-27 MED ORDER — MAGNESIUM SULFATE 40 GM/1000ML IV SOLN
2.0000 g/h | INTRAVENOUS | Status: DC
  Administered 2019-09-28: 2 g/h via INTRAVENOUS
  Filled 2019-09-27 (×3): qty 1000

## 2019-09-27 MED ORDER — LIDOCAINE HCL (PF) 1 % IJ SOLN
30.0000 mL | INTRAMUSCULAR | Status: DC | PRN
  Filled 2019-09-27: qty 30

## 2019-09-27 MED ORDER — LABETALOL HCL 100 MG PO TABS
ORAL_TABLET | ORAL | Status: AC
  Filled 2019-09-27: qty 2

## 2019-09-27 MED ORDER — PENICILLIN G POT IN DEXTROSE 60000 UNIT/ML IV SOLN
3.0000 10*6.[IU] | INTRAVENOUS | Status: DC
  Administered 2019-09-27 – 2019-09-28 (×4): 3 10*6.[IU] via INTRAVENOUS
  Filled 2019-09-27 (×4): qty 50

## 2019-09-27 MED ORDER — VALACYCLOVIR HCL 500 MG PO TABS
500.0000 mg | ORAL_TABLET | Freq: Two times a day (BID) | ORAL | Status: DC
  Filled 2019-09-27: qty 1

## 2019-09-27 MED ORDER — LABETALOL HCL 100 MG PO TABS
200.0000 mg | ORAL_TABLET | Freq: Two times a day (BID) | ORAL | Status: DC
  Administered 2019-09-27 – 2019-09-29 (×3): 200 mg via ORAL
  Filled 2019-09-27 (×2): qty 2

## 2019-09-27 MED ORDER — OXYTOCIN-SODIUM CHLORIDE 30-0.9 UT/500ML-% IV SOLN
1.0000 m[IU]/min | INTRAVENOUS | Status: DC
  Administered 2019-09-27: 2 m[IU]/min via INTRAVENOUS
  Administered 2019-09-28: 4 m[IU]/min via INTRAVENOUS
  Filled 2019-09-27: qty 500

## 2019-09-27 MED ORDER — LABETALOL HCL 5 MG/ML IV SOLN: 40.0000 mg | INTRAVENOUS | Status: DC | PRN

## 2019-09-27 MED ORDER — TERBUTALINE SULFATE 1 MG/ML IJ SOLN: 0.2500 mg | Freq: Once | INTRAMUSCULAR | Status: DC | PRN

## 2019-09-27 NOTE — Telephone Encounter (Signed)
[facility] calling to report patient is in there office and BP is 182/100 with leg swelling and trace protein. Advised patient needs to report thru ED for L&D monitoring.

## 2019-09-27 NOTE — H&P (Addendum)
OB History & Physical   History of Present Illness:  Chief Complaint:  Had an elevated blood pressure in her PCPs office today  of 180/100.  HPI:  Jillian Phillips is a 39 y.o. G24P2002 female with EDC=10/22/2019 at [redacted]w[redacted]d dated by LMP=11 week ultrasound.  Her pregnancy has been complicated by AMA, GBS bacteruria, ADD, history of substance abuse, current marijuana use, hx of genital herpes, chronic hepatitis C, anxiety/depression, and IUGR with G2 .  She presents to L&D for evaluation of elevated blood pressure.  Had blood pressure elevations noted at ER visit in first trimester 139/96 and prior to pregnancy 183/91(2020) and 136/93 (2017). At 32 weeks had a 142/88 in office and three days later 160/88 and 154/88. Sabin labs at that time were normal.  Has been told in the past that she had "white coat syndrome". She reports swelling in ankles, floaters and right epigastric pain for the past month. Denies headache, vaginal bleeding and leakage of fluid. Baby is moving well. An ultrasound 7/22 gave EFW of 4#1oz (29.4%). Two of three S/D ratios were normal and a third was slightly elevated.    Prenatal care site: Prenatal care at [facility] has also been remarkable for  [place] Prenatal Labs  Dating LMP = 11 week [place] Blood type: A pos  Genetic Screen NIPS: diploid XY SMA: neg FragileX: neg CF: neg Antibody:negative  Anatomic [place] Low lying placenta (1.3 cm from cx), incomplete anatomy Rubella: Immune Varicella: Immune  GTT 84 RPR: NR  Rhogam N/A HBsAg: negative  TDaP vaccine       08/30/19                Flu Shot: HIV: negative  Baby Food        Bottle                        GBS: Bacteruria  Contraception IUD GBS prior pregnancy:  Pelvis Tested  7lbs 10oz G1 Pap: 03/30/2019 NIL HPV negative [facility]  CS/VBAC N/A   Support Person          Maternal Medical History:   Past Medical History:  Diagnosis Date  . ADD (attention deficit disorder)   . Anxiety   . Chronic  hepatitis C ([place])   . Depression   . Genital HSV   . 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 Marijuana use     Past Surgical History:  Procedure Laterality Date  . NO PAST SURGERIES      No Known Allergies  Prior to Admission medications   Medication Sig Start Date End Date Taking? Authorizing Provider  amphetamine-dextroamphetamine (ADDERALL) 30 MG tablet Take 30 mg by mouth daily.   Yes [provider]  ARIPiprazole (ABILIFY) 10 MG tablet Take 10 mg by mouth daily.   Yes [provider]  venlafaxine XR (EFFEXOR-XR) 150 MG 24 hr capsule Take 150 mg by mouth daily with breakfast.   Yes [provider]  clindamycin (CLEOCIN-T) 1 % lotion May use daily on face pending OB clearance Patient not taking: Reported on 09/27/2019 08/30/19   Ralene Bathe, MD          Social History: She  reports that she has been smoking cigarettes. She has been smoking about 0.25 packs per day. She has never used smokeless tobacco. She reports previous drug use. She reports that she does not drink alcohol.  Family History: family history includes Autism in her son; Heart disease in her mother; Hypertension in her father and mother.   Review of Systems: Negative x 10 systems reviewed except as noted in the HPI.      Physical Exam:  Vital Signs: BP (!) 151/92   Pulse 80   Temp 97.9 F (36.6 C) (Oral)   Resp 16   Ht 5\' 9"  (1.753 m)   Wt 84.8 kg   LMP 01/15/2019 (Approximate)   BMI 27.62 kg/m   Patient Vitals for the past 24 hrs:  BP Temp Temp src Pulse Resp Height Weight  09/27/19 1750 (!) 151/92 -- -- -- -- -- --  09/27/19 1740 (!) 159/95 -- -- -- -- -- --  09/27/19 1730 (!) 149/91 -- -- -- -- -- --  09/27/19 1721 (!) 154/92 -- -- 80 -- -- --  09/27/19 1700 (!) 160/101 -- -- 89 -- -- --  09/27/19 1645 (!) 162/95 -- -- -- -- -- --  09/27/19 1630 (!) 167/93 -- -- -- -- -- --  09/27/19 1615 (!) 159/93 -- -- 81 -- -- --  09/27/19  1600 (!) 160/94 -- -- 92 -- -- --  09/27/19 1546 (!) 154/94 -- -- 87 -- -- --  09/27/19 1530 (!) 158/97 -- -- 92 -- -- --  09/27/19 1515 (!) 160/97 -- -- 93 -- -- --  09/27/19 1503 -- 97.9 F (36.6 C) Oral -- 16 5\' 9"  (1.753 m) 84.8 kg  09/27/19 1500 (!) 153/99 -- -- 96 -- -- --  General: gravid WF in no acute distress.  HEENT: normocephalic, atraumatic Heart: regular rate & rhythm.  No murmurs/rubs/gallops Lungs: clear to auscultation bilaterally Abdomen: soft, gravid, non-tender;  EFW: 5 #8oz. Pelvic:   External: Normal external female genitalia  Cervix:3/75%/-1 Extremities: non-tender, symmetric, trace edema bilaterally.  DTRs: +1  Neurologic: Alert & oriented x 3.   Baseline FHR: 125-130 baseline with accelerations to 160s to 170s, moderate variability, with one variable deceleration to 80 BPM x 60 sec when prone.  Toco: irregular mild contractions  Bedside Ultrasound: cephalic presentation/ ROP/ posterior fundal placenta AFI=16.2 cm  Results for orders placed or performed during the hospital encounter of 09/27/19 (from the past 24 hour(s))  Protein / creatinine ratio, urine     Status: Abnormal   Collection Time: 09/27/19  3:09 PM  Result Value Ref Range   Creatinine, Urine 210 mg/dL   Total Protein, Urine 50 mg/dL   Protein Creatinine Ratio 0.24 (H) 0.00 - 0.15 mg/mg[Cre]  Urine Drug Screen, Qualitative ([place] only)     Status: Abnormal   Collection Time: 09/27/19  3:09 PM  Result Value Ref Range   Tricyclic, Ur Screen NONE DETECTED NONE DETECTED   Amphetamines, Ur Screen POSITIVE (A) NONE DETECTED   MDMA (Ecstasy)Ur Screen NONE DETECTED NONE DETECTED   Cocaine Metabolite,Ur [place] NONE DETECTED NONE DETECTED   Opiate, Ur Screen NONE DETECTED NONE DETECTED   Phencyclidine (PCP) Ur S NONE DETECTED NONE DETECTED   Cannabinoid 50 Ng, Ur [place] POSITIVE (A) NONE DETECTED   Barbiturates, Ur Screen NONE DETECTED NONE DETECTED   Benzodiazepine, Ur Scrn NONE DETECTED NONE DETECTED    Methadone Scn, Ur NONE DETECTED NONE DETECTED  Type and screen [facility]     Status: None   Collection Time: 09/27/19  3:19 PM  Result Value Ref Range   ABO/RH(D) A POS    Antibody Screen NEG    Sample Expiration  09/30/2019,2359 Performed at [facility], [address] [place], [place] [address]   CBC     Status: Abnormal   Collection Time: 09/27/19  3:19 PM  Result Value Ref Range   WBC 12.8 (H) 4.0 - 10.5 K/uL   RBC 3.17 (L) 3.87 - 5.11 MIL/uL   Hemoglobin 8.6 (L) 12.0 - 15.0 g/dL   HCT 27.2 (L) 36 - 46 %   MCV 85.8 80.0 - 100.0 fL   MCH 27.1 26.0 - 34.0 pg   MCHC 31.6 30.0 - 36.0 g/dL   RDW 13.6 11.5 - 15.5 %   Platelets 209 150 - 400 K/uL   nRBC 0.0 0.0 - 0.2 %  Comprehensive metabolic panel     Status: Abnormal   Collection Time: 09/27/19  3:19 PM  Result Value Ref Range   Sodium 135 135 - 145 mmol/L   Potassium 3.9 3.5 - 5.1 mmol/L   Chloride 102 98 - 111 mmol/L   CO2 23 22 - 32 mmol/L   Glucose, Bld 104 (H) 70 - 99 mg/dL   BUN 12 6 - 20 mg/dL   Creatinine, Ser 0.60 0.44 - 1.00 mg/dL   Calcium 9.0 8.9 - 10.3 mg/dL   Total Protein 6.5 6.5 - 8.1 g/dL   Albumin 2.7 (L) 3.5 - 5.0 g/dL   AST 29 15 - 41 U/L   ALT 22 0 - 44 U/L   Alkaline Phosphatase 192 (H) 38 - 126 U/L   Total Bilirubin 0.5 0.3 - 1.2 mg/dL   GFR calc non Af Amer >60 >60 mL/min   GFR calc Af Amer >60 >60 mL/min   Anion gap 10 5 - 15    Assessment:  Jillian Phillips is a 39 y.o. G16P2002 female at [redacted]w[redacted]d with chronic hypertension with superimposed preeclampsia with severe features (severe blood pressures)  Consulted Dr Gilman Schmidt regarding plan of management: Will admit to L&D for IOL, treatment of elevated blood pressures/ preeclampsia. . Explained risks of seizures, stroke, abruption with severe range blood pressures. Discussed these risks are greater than the risks of prematurity. Betamethasone 12 mgm IM  Now and in 12 hours if not delivered. Magnesium sulfate for  seizure prophylaxis. Treat severe range blood pressures with IV labetalol/ or hydralazine. Will start 200 mgm labetalol BID po GBS bacteruria: start PCN PPX.  Chronic hepatitis C: avoid VAVD, FSE to decrease risk of transmission to baby. Refer to a hepatologist after 6 week postpartum visit. Anxiety / depression: continue Abilify and Effexor Start Pitocin induction after Covid results are back Bottle A POS/RI/ VI Contraception: undecided TDAP: given AP COvid vaccine: given AP    Dalia Heading  09/27/2019 7:12 PM

## 2019-09-27 NOTE — OB Triage Note (Signed)
Pt is a G3P2 at [redacted]w[redacted]d that presents from ED with c/o elevated blood pressure, swelling in ankles, right epigastric pain, and floaters. Pt states she has had swelling in her ankles, floaters and right epigastric pain for the past month. Pt was seen today by her PCP and had 180/100 Blood pressure. Pt was asked if she had high blood pressure when she wasn't pregnant and she stated "Yes. They say I have White coat syndrome." Reflexes +2, no clonus present on assessment. Pt denies Headache, VB, LOF and states positive Fetal movement. EFM applied and intial fht 135. Initial BP 153/99 and cycling q 41min. Provider aware of pt arrival to unit.

## 2019-09-27 NOTE — Telephone Encounter (Signed)
Spoke w/Mindy at L&D to advise of patient's symptoms and being sent for monitoring.

## 2019-09-28 ENCOUNTER — Inpatient Hospital Stay: Payer: Medicaid Other | Admitting: Anesthesiology

## 2019-09-28 ENCOUNTER — Encounter: Payer: Self-pay | Admitting: Obstetrics and Gynecology

## 2019-09-28 DIAGNOSIS — O09523 Supervision of elderly multigravida, third trimester: Secondary | ICD-10-CM

## 2019-09-28 DIAGNOSIS — Z3A36 36 weeks gestation of pregnancy: Secondary | ICD-10-CM

## 2019-09-28 DIAGNOSIS — O114 Pre-existing hypertension with pre-eclampsia, complicating childbirth: Principal | ICD-10-CM

## 2019-09-28 MED ORDER — MISOPROSTOL 200 MCG PO TABS: 800.0000 ug | ORAL_TABLET | Freq: Once | ORAL | Status: AC

## 2019-09-28 MED ORDER — LIDOCAINE HCL (PF) 1 % IJ SOLN
INTRAMUSCULAR | Status: DC | PRN
  Administered 2019-09-28: 2 mL

## 2019-09-28 MED ORDER — EPHEDRINE 5 MG/ML INJ: 10.0000 mg | INTRAVENOUS | Status: DC | PRN

## 2019-09-28 MED ORDER — ARIPIPRAZOLE 10 MG PO TABS
10.0000 mg | ORAL_TABLET | Freq: Every day | ORAL | Status: DC
  Administered 2019-09-28 – 2019-09-30 (×3): 10 mg via ORAL
  Filled 2019-09-28 (×4): qty 1

## 2019-09-28 MED ORDER — FAMOTIDINE 20 MG PO TABS
20.0000 mg | ORAL_TABLET | Freq: Two times a day (BID) | ORAL | Status: DC | PRN
  Administered 2019-09-28: 20 mg via ORAL
  Filled 2019-09-28: qty 1

## 2019-09-28 MED ORDER — IBUPROFEN 600 MG PO TABS
ORAL_TABLET | ORAL | Status: AC
  Administered 2019-09-28: 600 mg via ORAL
  Filled 2019-09-28: qty 1

## 2019-09-28 MED ORDER — FENTANYL 2.5 MCG/ML W/ROPIVACAINE 0.15% IN NS 100 ML EPIDURAL (ARMC): 12.0000 mL/h | EPIDURAL | Status: DC

## 2019-09-28 MED ORDER — DIPHENHYDRAMINE HCL 25 MG PO CAPS: 25.0000 mg | ORAL_CAPSULE | Freq: Four times a day (QID) | ORAL | Status: DC | PRN

## 2019-09-28 MED ORDER — PRENATAL MULTIVITAMIN CH
1.0000 | ORAL_TABLET | Freq: Every day | ORAL | Status: DC
  Administered 2019-09-29 – 2019-09-30 (×2): 1 via ORAL
  Filled 2019-09-28 (×2): qty 1

## 2019-09-28 MED ORDER — BENZOCAINE-MENTHOL 20-0.5 % EX AERO: 1.0000 "application " | INHALATION_SPRAY | CUTANEOUS | Status: DC | PRN

## 2019-09-28 MED ORDER — DIBUCAINE (PERIANAL) 1 % EX OINT: 1.0000 "application " | TOPICAL_OINTMENT | CUTANEOUS | Status: DC | PRN

## 2019-09-28 MED ORDER — ACETAMINOPHEN 325 MG PO TABS
650.0000 mg | ORAL_TABLET | ORAL | Status: DC | PRN
  Administered 2019-09-29: 650 mg via ORAL
  Filled 2019-09-28: qty 2

## 2019-09-28 MED ORDER — SIMETHICONE 80 MG PO CHEW: 80.0000 mg | CHEWABLE_TABLET | ORAL | Status: DC | PRN

## 2019-09-28 MED ORDER — PHENYLEPHRINE 40 MCG/ML (10ML) SYRINGE FOR IV PUSH (FOR BLOOD PRESSURE SUPPORT): 80.0000 ug | PREFILLED_SYRINGE | INTRAVENOUS | Status: DC | PRN

## 2019-09-28 MED ORDER — SENNOSIDES-DOCUSATE SODIUM 8.6-50 MG PO TABS
2.0000 | ORAL_TABLET | ORAL | Status: DC
  Administered 2019-09-29: 2 via ORAL
  Filled 2019-09-28: qty 2

## 2019-09-28 MED ORDER — LACTATED RINGERS IV SOLN: 500.0000 mL | Freq: Once | INTRAVENOUS | Status: DC

## 2019-09-28 MED ORDER — LIDOCAINE-EPINEPHRINE (PF) 1.5 %-1:200000 IJ SOLN
INTRAMUSCULAR | Status: DC | PRN
  Administered 2019-09-28: 3 mL via EPIDURAL

## 2019-09-28 MED ORDER — COCONUT OIL OIL: 1.0000 "application " | TOPICAL_OIL | Status: DC | PRN

## 2019-09-28 MED ORDER — FENTANYL 2.5 MCG/ML W/ROPIVACAINE 0.15% IN NS 100 ML EPIDURAL (ARMC)
EPIDURAL | Status: DC | PRN
  Administered 2019-09-28: 12 mL/h via EPIDURAL

## 2019-09-28 MED ORDER — IBUPROFEN 600 MG PO TABS
600.0000 mg | ORAL_TABLET | Freq: Four times a day (QID) | ORAL | Status: DC
  Administered 2019-09-29 – 2019-09-30 (×7): 600 mg via ORAL
  Filled 2019-09-28 (×7): qty 1

## 2019-09-28 MED ORDER — WITCH HAZEL-GLYCERIN EX PADS: 1.0000 "application " | MEDICATED_PAD | CUTANEOUS | Status: DC | PRN

## 2019-09-28 MED ORDER — FENTANYL 2.5 MCG/ML W/ROPIVACAINE 0.15% IN NS 100 ML EPIDURAL (ARMC)
EPIDURAL | Status: AC
  Filled 2019-09-28: qty 100

## 2019-09-28 MED ORDER — DIPHENHYDRAMINE HCL 50 MG/ML IJ SOLN: 12.5000 mg | INTRAMUSCULAR | Status: DC | PRN

## 2019-09-28 MED ORDER — VENLAFAXINE HCL ER 150 MG PO CP24
150.0000 mg | ORAL_CAPSULE | Freq: Every day | ORAL | Status: DC
  Administered 2019-09-28 – 2019-09-30 (×3): 150 mg via ORAL
  Filled 2019-09-28 (×4): qty 1

## 2019-09-28 MED ORDER — LABETALOL HCL 5 MG/ML IV SOLN
10.0000 mg | Freq: Once | INTRAVENOUS | Status: AC
  Administered 2019-09-28: 10 mg via INTRAVENOUS

## 2019-09-28 MED ORDER — SODIUM CHLORIDE 0.9 % IV SOLN
INTRAVENOUS | Status: DC | PRN
  Administered 2019-09-28 (×2): 5 mL via EPIDURAL

## 2019-09-28 MED ORDER — FERROUS SULFATE 325 (65 FE) MG PO TABS
325.0000 mg | ORAL_TABLET | Freq: Two times a day (BID) | ORAL | Status: DC
  Administered 2019-09-29 – 2019-09-30 (×4): 325 mg via ORAL
  Filled 2019-09-28 (×4): qty 1

## 2019-09-28 NOTE — Anesthesia Procedure Notes (Signed)
Epidural Patient location during procedure: OB Start time: 09/28/2019 2:55 AM End time: 09/28/2019 3:00 AM  Staffing Anesthesiologist: Martha Clan, MD Performed: anesthesiologist   Preanesthetic Checklist Completed: patient identified, IV checked, site marked, risks and benefits discussed, surgical consent, monitors and equipment checked, pre-op evaluation and timeout performed  Epidural Patient position: sitting Prep: ChloraPrep Patient monitoring: heart rate, continuous pulse ox and blood pressure Approach: midline Location: L3-L4 Injection technique: LOR saline  Needle:  Needle type: Tuohy  Needle gauge: 17 G Needle length: 9 cm and 9 Needle insertion depth: 4.5 cm Catheter type: closed end flexible Catheter size: 19 Gauge Catheter at skin depth: 9.5 cm Test dose: negative and 1.5% lidocaine with Epi 1:200 K  Assessment Sensory level: T10 Events: blood not aspirated, injection not painful, no injection resistance, no paresthesia and negative IV test  Additional Notes 1st attempt Pt. Evaluated and documentation done after procedure finished. Patient identified. Risks/Benefits/Options discussed with patient including but not limited to bleeding, infection, nerve damage, paralysis, failed block, incomplete pain control, headache, blood pressure changes, nausea, vomiting, reactions to medication both or allergic, itching and postpartum back pain. Confirmed with bedside nurse the patient's most recent platelet count. Confirmed with patient that they are not currently taking any anticoagulation, have any bleeding history or any family history of bleeding disorders. Patient expressed understanding and wished to proceed. All questions were answered. Sterile technique was used throughout the entire procedure. Please see nursing notes for vital signs. Test dose was given through epidural catheter and negative prior to continuing to dose epidural or start infusion. Warning signs of high  block given to the patient including shortness of breath, tingling/numbness in hands, complete motor block, or any concerning symptoms with instructions to call for help. Patient was given instructions on fall risk and not to get out of bed. All questions and concerns addressed with instructions to call with any issues or inadequate analgesia.   Patient tolerated the insertion well without immediate complications.Reason for block:procedure for pain

## 2019-09-28 NOTE — Progress Notes (Signed)
L&D Progress Note    S: Slept intermittently during the night after epidural anesthesia. Feels well this AM.   O:  Patient Vitals for the past 24 hrs:  BP Temp Temp src Pulse Resp SpO2 Height Weight  09/28/19 0700 -- -- -- -- -- 100 % -- --  09/28/19 0651 121/68 -- -- 68 -- -- -- --  09/28/19 0642 -- -- -- -- -- 100 % -- --  09/28/19 0630 -- -- -- -- -- 100 % -- --  09/28/19 0621 126/69 -- -- 70 -- -- -- --  09/28/19 0612 -- 97.9 F (36.6 C) Oral -- -- 100 % -- --  09/28/19 0600 -- -- -- -- -- 100 % -- --  09/28/19 0551 121/68 -- -- 61 -- -- -- --  09/28/19 0521 123/71 -- -- 63 -- -- -- --  09/28/19 0515 -- 97.9 F (36.6 C) Oral -- -- -- -- --  09/28/19 0451 (!) 107/57 -- -- (!) 57 -- 100 % -- --  09/28/19 0430 -- -- -- -- -- 100 % -- --  09/28/19 0425 -- 97.7 F (36.5 C) Axillary -- -- 100 % -- --  09/28/19 0421 (!) 103/59 -- -- 62 -- -- -- --  09/28/19 0411 -- -- -- -- -- 100 % -- --  09/28/19 0400 (!) 98/45 -- -- 60 -- 99 % -- --  09/28/19 0351 (!) 95/39 -- -- 60 -- -- -- --  09/28/19 0336 (!) 114/58 -- -- 68 -- -- -- --  09/28/19 0313 135/82 -- -- 72 -- -- -- --  09/28/19 0308 130/86 -- -- 71 -- -- -- --  09/28/19 0303 (!) 144/80 -- -- 75 -- -- -- --  09/28/19 0300 (!) 149/85 -- -- 74 -- 98 % -- --  09/28/19 0235 (!) 148/80 -- -- 67 -- 99 % -- --  09/28/19 0205 (!) 143/90 -- -- 67 -- -- -- --  09/28/19 0135 132/81 -- -- 68 -- -- -- --  09/28/19 0105 (!) 141/81 -- -- 76 -- -- -- --  09/28/19 0035 (!) 151/87 -- -- 76 -- -- -- --  09/28/19 0007 137/68 -- -- 79 -- -- -- --  09/27/19 2335 135/72 -- -- 70 -- -- -- --  09/27/19 2304 (!) 159/91 -- -- 88 -- -- -- --  09/27/19 2235 (!) 147/82 -- -- 84 -- -- -- --  09/27/19 2205 (!) 155/81 -- -- 79 -- -- -- --  09/27/19 2135 (!) 141/70 -- -- 73 -- -- -- --  09/27/19 2105 (!) 143/70 -- -- 71 -- -- -- --  09/27/19 2035 140/90 -- -- 77 -- -- -- --  09/27/19 1950 (!) 158/87 -- -- 75 -- -- -- --  09/27/19 1935 (!) 160/90 -- -- 92  -- -- -- --  09/27/19 1920 (!) 154/88 -- -- 75 -- -- -- --  09/27/19 1915 -- 97.7 F (36.5 C) Oral -- -- -- -- --  09/27/19 1905 (!) 147/94 -- -- 77 -- -- -- --  09/27/19 1750 (!) 151/92 -- -- -- -- -- -- --  09/27/19 1740 (!) 159/95 -- -- -- -- -- -- --  09/27/19 1730 (!) 149/91 -- -- -- -- -- -- --  09/27/19 1721 (!) 154/92 -- -- 80 -- -- -- --  09/27/19 1700 (!) 160/101 -- -- 89 -- -- -- --  09/27/19 1645 (!) 162/95 -- -- -- -- -- -- --  09/27/19  1630 (!) 167/93 -- -- -- -- -- -- --  09/27/19 1615 (!) 159/93 -- -- 81 -- -- -- --  09/27/19 1600 (!) 160/94 -- -- 92 -- -- -- --  09/27/19 1546 (!) 154/94 -- -- 87 -- -- -- --  09/27/19 1530 (!) 158/97 -- -- 92 -- -- -- --  09/27/19 1515 (!) 160/97 -- -- 93 -- -- -- --  09/27/19 1503 -- 97.9 F (36.6 C) Oral -- 16 -- 5\' 9"  (1.753 m) 84.8 kg  09/27/19 1500 (!) 153/99 -- -- 96 -- -- -- --     Has been normotensive since epidural and had some 90s/50s blood pressures initially after epidural-treated with IV fluid bolus Urine output averages between 125-350 ml/hour. Since the epidural the contractions have spaced out despite having AROM of moderate clear fluid at 0347 and increasing Pitocin to 24 miu. Contractions this AM every 4-12 minutes apart FHR 100s to 110, moderate variability with accelerations Cervix: 4.5/80%/-1 to 0  A/P: IUP at 36wk4d with CHTN with superimposed preeclampsia with severe features  Normotensive since epidural  Inadequate labor on 24 miu Pitocin-discontinue Pitocin x 1 hour then restart with fresh  bag of Pitocin at 4 miu.  Continue magnesium sulfate  Continue labetalol 200 mgm BID  Continue on Abilify 10 mgm daily and Effexor 150 mgm XL  Monitor hourly blood pressures and urine output  Dalia Heading, CNM

## 2019-09-28 NOTE — Discharge Summary (Signed)
Postpartum Discharge Summary  Patient Name: Jillian Phillips DOB: Nov 27, 1980 MRN: [id number]  Date of admission: 09/27/2019 Delivery date:09/28/2019  Delivering provider: Prentice Docker D  Date of discharge: 09/30/2019  Admitting diagnosis: Elevated blood pressure affecting pregnancy in third trimester, antepartum [O16.3] Chronic hypertension with superimposed preeclampsia [O11.9] Intrauterine pregnancy: [redacted]w[redacted]d    Secondary diagnosis:  Active Problems:   History of prior pregnancy with IUGR newborn   Chronic viral hepatitis complicating pregnancy ([place]   Antepartum multigravida of advanced maternal age   Supervision of high risk pregnancy, antepartum   Genital herpes affecting pregnancy, antepartum   GBS bacteriuria   Chronic hypertension with superimposed preeclampsia  Additional problems: none    Discharge diagnosis: Preterm Pregnancy Delivered and CHTN with superimposed preeclampsia                                              Post partum procedures:none Augmentation: AROM and Pitocin Complications: None  Hospital course: Induction of Labor With Vaginal Delivery   39y.o. yo G3P2002 at 386w4das admitted to the hospital 09/27/2019 for induction of labor.  Indication for induction: chronic hypertension with superimposed severe preeclampsia.  Patient had an uncomplicated labor course as follows: Membrane Rupture Time/Date: 3:47 AM ,09/28/2019   Delivery Method:Vaginal, Spontaneous  Episiotomy: None  Lacerations:  None  Details of delivery can be found in separate delivery note.  Patient had a routine postpartum course. Patient is discharged home 09/30/19.  Newborn Data: Birth date:09/28/2019  Birth time:11:30 AM  Gender:Female  Living status:Living  Apgars:7 ,9  Weight:2540 g   Magnesium Sulfate received: No BMZ received: Yes (1 dose) Rhophylac:N/A MMR:No T-DaP:Given prenatally on 08/30/2019 Transfusion:No  Physical exam  Vitals:   09/30/19 0920 09/30/19 1153  09/30/19 1331 09/30/19 1504  BP: (!) 150/83 139/71 126/67 139/76  Pulse: 66 72 79 75  Resp:  18    Temp:  97.7 F (36.5 C)    TempSrc:  Oral    SpO2:      Weight:      Height:       General: alert, cooperative and no distress Lochia: appropriate Uterine Fundus: firm Incision: N/A DVT Evaluation: No evidence of DVT seen on physical exam. Labs: Lab Results  Component Value Date   WBC 9.7 09/30/2019   HGB 7.3 (L) 09/30/2019   HCT 23.5 (L) 09/30/2019   MCV 88.3 09/30/2019   PLT 199 09/30/2019   CMP Latest Ref Rng & Units 09/27/2019  Glucose 70 - 99 mg/dL 104(H)  BUN 6 - 20 mg/dL 12  Creatinine 0.44 - 1.00 mg/dL 0.60  Sodium 135 - 145 mmol/L 135  Potassium 3.5 - 5.1 mmol/L 3.9  Chloride 98 - 111 mmol/L 102  CO2 22 - 32 mmol/L 23  Calcium 8.9 - 10.3 mg/dL 9.0  Total Protein 6.5 - 8.1 g/dL 6.5  Total Bilirubin 0.3 - 1.2 mg/dL 0.5  Alkaline Phos 38 - 126 U/L 192(H)  AST 15 - 41 U/L 29  ALT 0 - 44 U/L 22   Edinburgh Score: Edinburgh Postnatal Depression Scale Screening Tool 09/29/2019  I have been able to laugh and see the funny side of things. 0  I have looked forward with enjoyment to things. 0  I have blamed myself unnecessarily when things went wrong. 1  I have been anxious or worried for no good reason. 1  I have felt scared or panicky for no good reason. 1  Things have been getting on top of me. 0  I have been so unhappy that I have had difficulty sleeping. 0  I have felt sad or miserable. 0  I have been so unhappy that I have been crying. 0  The thought of harming myself has occurred to me. 0  Edinburgh Postnatal Depression Scale Total 3      After visit meds:  Allergies as of 09/30/2019   No Known Allergies     Medication List    TAKE these medications   amphetamine-dextroamphetamine 30 MG tablet Commonly known as: ADDERALL Take 30 mg by mouth daily.   ARIPiprazole 10 MG tablet Commonly known as: ABILIFY Take 10 mg by mouth daily.     benzocaine-Menthol 20-0.5 % Aero Commonly known as: DERMOPLAST Apply 1 application topically as needed for irritation (perineal discomfort).   clindamycin 1 % lotion Commonly known as: Cleocin-T May use daily on face pending OB clearance   ferrous sulfate 325 (65 FE) MG tablet Take 1 tablet (325 mg total) by mouth 2 (two) times daily with a meal.   ibuprofen 600 MG tablet Commonly known as: ADVIL Take 1 tablet (600 mg total) by mouth every 6 (six) hours.   labetalol 300 MG tablet Commonly known as: NORMODYNE Take 2 tablets (600 mg total) by mouth 3 (three) times daily.   NIFEdipine 10 MG capsule Commonly known as: PROCARDIA Take 1 capsule (10 mg total) by mouth 3 (three) times daily.   venlafaxine XR 150 MG 24 hr capsule Commonly known as: EFFEXOR-XR Take 150 mg by mouth daily with breakfast.            Discharge Care Instructions  (From admission, onward)         Start     Ordered   09/30/19 0000  Discharge wound care:       Comments: SHOWER DAILY Wash incision gently with soap and water.  Call office with any drainage, redness, or firmness of the incision.   09/30/19 1543           Discharge home in stable condition Infant Feeding: Formula  Infant Disposition:home with mother Discharge instruction: per After Visit Summary and Postpartum booklet. Activity: Advance as tolerated. Pelvic rest for 6 weeks.  Diet: routine diet Anticipated Birth Control: IUD Postpartum Appointment:1 week Additional Postpartum F/U: Postpartum Depression checkup and BP check 1 week Future Appointments: Future Appointments  Date Time Provider [place]  10/05/2019  1:30 PM Ralene Bathe, MD [facility] None  10/07/2019  9:10 AM Will Bonnet, MD [facility] None   Follow up Visit:  Follow-up Information    Will Bonnet, MD. Go on 10/07/2019.   Specialty: Obstetrics and Gynecology Why: Follow-up appointment: Thursday, September 26th at 9:00am with Dr. Glennon Mac at  the [facility] office!  Contact information: [place] [address] [phone]        [facility], [place]. Schedule an appointment as soon as possible for a visit on 10/04/2019.   Why: Please call to schedule a postpartum follow up appointment for Monday 10/04/19 for a blood pressure check Contact information: [address] [place] [address] [phone]               SIGNED: Adrian Prows MD, [place], [place] [facility] 09/30/2019 3:45 PM

## 2019-09-28 NOTE — Anesthesia Preprocedure Evaluation (Signed)
Anesthesia Evaluation  Patient identified by MRN, date of birth, ID band Patient awake    Reviewed: Allergy & Precautions, H&P , NPO status , Patient's Chart, lab work & pertinent test results, reviewed documented beta blocker date and time   History of Anesthesia Complications Negative for: history of anesthetic complications  Airway Mallampati: III  TM Distance: >3 FB Neck ROM: full  Mouth opening: Limited Mouth Opening  Dental no notable dental hx.    Pulmonary neg shortness of breath, neg COPD, neg recent URI, Current Smoker,    Pulmonary exam normal breath sounds clear to auscultation       Cardiovascular Exercise Tolerance: Good hypertension (gestational), (-) angina(-) Past MI and (-) Cardiac Stents Normal cardiovascular exam(-) dysrhythmias (-) Valvular Problems/Murmurs Rhythm:regular Rate:Normal     Neuro/Psych Seizures -,  PSYCHIATRIC DISORDERS Anxiety Depression    GI/Hepatic GERD  ,(+)     substance abuse (h/o cocaine and heroin abuse, none currently)  cocaine use, Hepatitis -, C  Endo/Other  negative endocrine ROS  Renal/GU negative Renal ROS  negative genitourinary   Musculoskeletal   Abdominal   Peds  Hematology negative hematology ROS (+)   Anesthesia Other Findings Past Medical History: No date: ADD (attention deficit disorder) No date: Anxiety No date: Chronic hepatitis C (HCC) No date: Depression No date: Genital HSV No date: History of substance use disorder     Comment:  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 No date: Marijuana use   Reproductive/Obstetrics negative OB ROS                             Anesthesia Physical Anesthesia Plan  ASA: III  Anesthesia Plan: Epidural   Post-op Pain Management:    Induction:   PONV Risk Score and Plan:   Airway Management Planned:   Additional Equipment:   Intra-op Plan:   Post-operative Plan:   Informed Consent: I  have reviewed the patients History and Physical, chart, labs and discussed the procedure including the risks, benefits and alternatives for the proposed anesthesia with the patient or authorized representative who has indicated his/her understanding and acceptance.     Dental Advisory Given  Plan Discussed with: Anesthesiologist, CRNA and Surgeon  Anesthesia Plan Comments:         Anesthesia Quick Evaluation

## 2019-09-28 NOTE — Progress Notes (Signed)
Clonus 1 beat, new headache, elevated BP despite PO labetalol. Denies vision changes and epigastric pain. Call to Glennon Mac, MD. Ordered 10mg  IV Labetalol once, followed by 10mg  IV Labetalol if desired response not achieved. Patient in agreement with plan, no questions. 10mg  IV Labetolol in-BP responded well.

## 2019-09-29 ENCOUNTER — Encounter: Payer: Medicaid Other | Admitting: Obstetrics

## 2019-09-29 LAB — CBC
HCT: 23.3 % — ABNORMAL LOW (ref 36.0–46.0)
Hemoglobin: 7.4 g/dL — ABNORMAL LOW (ref 12.0–15.0)
MCH: 27.4 pg (ref 26.0–34.0)
MCHC: 31.8 g/dL (ref 30.0–36.0)
MCV: 86.3 fL (ref 80.0–100.0)
Platelets: 191 10*3/uL (ref 150–400)
RBC: 2.7 MIL/uL — ABNORMAL LOW (ref 3.87–5.11)
RDW: 14.1 % (ref 11.5–15.5)
WBC: 15.7 10*3/uL — ABNORMAL HIGH (ref 4.0–10.5)
nRBC: 0 % (ref 0.0–0.2)

## 2019-09-29 MED ORDER — LABETALOL HCL 200 MG PO TABS
400.0000 mg | ORAL_TABLET | Freq: Two times a day (BID) | ORAL | Status: DC
  Administered 2019-09-29 – 2019-09-30 (×3): 400 mg via ORAL
  Filled 2019-09-29: qty 4
  Filled 2019-09-29 (×2): qty 2

## 2019-09-29 MED ORDER — ONDANSETRON HCL 4 MG PO TABS
4.0000 mg | ORAL_TABLET | ORAL | Status: DC | PRN
  Filled 2019-09-29: qty 1

## 2019-09-29 MED ORDER — ONDANSETRON HCL 4 MG/2ML IJ SOLN: 4.0000 mg | INTRAMUSCULAR | Status: DC | PRN

## 2019-09-29 NOTE — Progress Notes (Signed)
Subjective:    Objective:  Vital signs in last 24 hours: Temp:  [97.6 F (36.4 C)-98.6 F (37 C)] 97.8 F (36.6 C) (08/18 0730) Pulse Rate:  [67-98] 85 (08/18 0922) Resp:  [16-18] 18 (08/18 0730) BP: (114-172)/(32-100) 156/88 (08/18 0922) SpO2:  [96 %-100 %] 96 % (08/18 0922)    General: NAD Pulmonary: no increased work of breathing Abdomen: non-distended, non-tender, fundus firm at level of umbilicus Extremities: no edema, no erythema, no tenderness  Results for orders placed or performed during the hospital encounter of 09/27/19 (from the past 72 hour(s))  Protein / creatinine ratio, urine     Status: Abnormal   Collection Time: 09/27/19  3:09 PM  Result Value Ref Range   Creatinine, Urine 210 mg/dL   Total Protein, Urine 50 mg/dL    Comment: NO NORMAL RANGE ESTABLISHED FOR THIS TEST   Protein Creatinine Ratio 0.24 (H) 0.00 - 0.15 mg/mg[Cre]    Comment: Performed at [facility], [address] [place], [place] [address]  Urine Drug Screen, Qualitative ([place] only)     Status: Abnormal   Collection Time: 09/27/19  3:09 PM  Result Value Ref Range   Tricyclic, Ur Screen NONE DETECTED NONE DETECTED   Amphetamines, Ur Screen POSITIVE (A) NONE DETECTED   MDMA (Ecstasy)Ur Screen NONE DETECTED NONE DETECTED   Cocaine Metabolite,Ur [place] NONE DETECTED NONE DETECTED   Opiate, Ur Screen NONE DETECTED NONE DETECTED   Phencyclidine (PCP) Ur S NONE DETECTED NONE DETECTED   Cannabinoid 50 Ng, Ur [place] POSITIVE (A) NONE DETECTED   Barbiturates, Ur Screen NONE DETECTED NONE DETECTED   Benzodiazepine, Ur Scrn NONE DETECTED NONE DETECTED   Methadone Scn, Ur NONE DETECTED NONE DETECTED    Comment: (NOTE) Tricyclics + metabolites, urine    Cutoff 1000 ng/mL Amphetamines + metabolites, urine  Cutoff 1000 ng/mL MDMA (Ecstasy), urine              Cutoff 500 ng/mL Cocaine Metabolite, urine          Cutoff 300 ng/mL Opiate + metabolites, urine        Cutoff 300 ng/mL Phencyclidine  (PCP), urine         Cutoff 25 ng/mL Cannabinoid, urine                 Cutoff 50 ng/mL Barbiturates + metabolites, urine  Cutoff 200 ng/mL Benzodiazepine, urine              Cutoff 200 ng/mL Methadone, urine                   Cutoff 300 ng/mL  The urine drug screen provides only a preliminary, unconfirmed analytical test result and should not be used for non-medical purposes. Clinical consideration and professional judgment should be applied to any positive drug screen result due to possible interfering substances. A more specific alternate chemical method must be used in order to obtain a confirmed analytical result. Gas chromatography / mass spectrometry (GC/MS) is the preferred confirm atory method. Performed at [facility], [address] [place], [place] [address]   Type and screen [place]     Status: None   Collection Time: 09/27/19  3:19 PM  Result Value Ref Range   ABO/RH(D) A POS    Antibody Screen NEG    Sample Expiration      09/30/2019,2359 Performed at [facility], [address], [place], [place] [address]   CBC  Status: Abnormal   Collection Time: 09/27/19  3:19 PM  Result Value Ref Range   WBC 12.8 (H) 4.0 - 10.5 K/uL   RBC 3.17 (L) 3.87 - 5.11 MIL/uL   Hemoglobin 8.6 (L) 12.0 - 15.0 g/dL   HCT 27.2 (L) 36 - 46 %   MCV 85.8 80.0 - 100.0 fL   MCH 27.1 26.0 - 34.0 pg   MCHC 31.6 30.0 - 36.0 g/dL   RDW 13.6 11.5 - 15.5 %   Platelets 209 150 - 400 K/uL   nRBC 0.0 0.0 - 0.2 %    Comment: Performed at [facility], [address], [place], [place] [address]  Comprehensive metabolic panel     Status: Abnormal   Collection Time: 09/27/19  3:19 PM  Result Value Ref Range   Sodium 135 135 - 145 mmol/L   Potassium 3.9 3.5 - 5.1 mmol/L   Chloride 102 98 - 111 mmol/L   CO2 23 22 - 32 mmol/L   Glucose, Bld 104 (H) 70 - 99 mg/dL    Comment: Glucose reference range applies only to samples taken after fasting  for at least 8 hours.   BUN 12 6 - 20 mg/dL   Creatinine, Ser 0.60 0.44 - 1.00 mg/dL   Calcium 9.0 8.9 - 10.3 mg/dL   Total Protein 6.5 6.5 - 8.1 g/dL   Albumin 2.7 (L) 3.5 - 5.0 g/dL   AST 29 15 - 41 U/L   ALT 22 0 - 44 U/L   Alkaline Phosphatase 192 (H) 38 - 126 U/L   Total Bilirubin 0.5 0.3 - 1.2 mg/dL   GFR calc non Af Amer >60 >60 mL/min   GFR calc Af Amer >60 >60 mL/min   Anion gap 10 5 - 15    Comment: Performed at [facility], [address], [place], [place] [address]  Group B strep by PCR     Status: Abnormal   Collection Time: 09/27/19  6:12 PM   Specimen: Nasopharyngeal Swab; Genital  Result Value Ref Range   Group B strep by PCR POSITIVE (A) NEGATIVE    Comment: (NOTE) Intrapartum testing with Xpert GBS assay should be used as an adjunct to other methods available and not used to replace antepartum testing (at 35-[redacted] weeks gestation). Performed at [facility], [address] [place], [place] [address]   SARS Coronavirus 2 by RT PCR (hospital order, performed in [facility] hospital lab) Nasopharyngeal Nasopharyngeal Swab     Status: None   Collection Time: 09/27/19  6:12 PM   Specimen: Nasopharyngeal Swab  Result Value Ref Range   SARS Coronavirus 2 NEGATIVE NEGATIVE    Comment: (NOTE) SARS-CoV-2 target nucleic acids are NOT DETECTED.  The SARS-CoV-2 RNA is generally detectable in upper and lower respiratory specimens during the acute phase of infection. The lowest concentration of SARS-CoV-2 viral copies this assay can detect is 250 copies / mL. A negative result does not preclude SARS-CoV-2 infection and should not be used as the sole basis for treatment or other patient management decisions.  A negative result may occur with improper specimen collection / handling, submission of specimen other than nasopharyngeal swab, presence of viral mutation(s) within the areas targeted by this assay, and inadequate number of viral copies (<250  copies / mL). A negative result must be combined with clinical observations, patient history, and epidemiological information.  Fact Sheet for Patients:   StrictlyIdeas.no  Fact Sheet for Healthcare Providers: BankingDealers.co.za  This test  is not yet approved or  cleared by the [place] and has been authorized for detection and/or diagnosis of SARS-CoV-2 by FDA under an Emergency Use Authorization (EUA).  This EUA will remain in effect (meaning this test can be used) for the duration of the COVID-19 declaration under Section 564(b)(1) of the Act, 21 U.S.C. section 360bbb-3(b)(1), unless the authorization is terminated or revoked sooner.  Performed at [facility], [address] [place], [place] [address]   CBC     Status: Abnormal   Collection Time: 09/27/19 11:07 PM  Result Value Ref Range   WBC 15.8 (H) 4.0 - 10.5 K/uL   RBC 3.30 (L) 3.87 - 5.11 MIL/uL   Hemoglobin 9.0 (L) 12.0 - 15.0 g/dL   HCT 28.2 (L) 36 - 46 %   MCV 85.5 80.0 - 100.0 fL   MCH 27.3 26.0 - 34.0 pg   MCHC 31.9 30.0 - 36.0 g/dL   RDW 13.7 11.5 - 15.5 %   Platelets 222 150 - 400 K/uL   nRBC 0.1 0.0 - 0.2 %    Comment: Performed at [facility], [address] [place], [place] [address]  CBC     Status: Abnormal   Collection Time: 09/29/19  6:31 AM  Result Value Ref Range   WBC 15.7 (H) 4.0 - 10.5 K/uL   RBC 2.70 (L) 3.87 - 5.11 MIL/uL   Hemoglobin 7.4 (L) 12.0 - 15.0 g/dL   HCT 23.3 (L) 36 - 46 %   MCV 86.3 80.0 - 100.0 fL   MCH 27.4 26.0 - 34.0 pg   MCHC 31.8 30.0 - 36.0 g/dL   RDW 14.1 11.5 - 15.5 %   Platelets 191 150 - 400 K/uL   nRBC 0.0 0.0 - 0.2 %    Comment: Performed at [facility], [address], [place], [place] [address]   Immunization History  Administered Date(s) Administered  . Influenza,inj,Quad PF,6+ Mos 11/26/2018  . PFIZER SARS-COV-2 Vaccination 06/25/2019, 07/20/2019  . Tdap 10/16/2018,  08/30/2019     Assessment:   39 y.o. O0H2122 postpartum day # 1 severe preeclampsia  Plan:    1) Acute blood loss anemia - hemodynamically stable and asymptomatic - po ferrous sulfate - recheck CBC tomorrow AM  2) Blood Type --/--/A POS (08/16 1519) / Rubella 13.20 (03/16 1426) / Varicella Immune  3) TDAP status up to date 08/30/2019  4) Feeding plan breast  5)  Education given regarding options for contraception, as well as compatibility with breast feeding if applicable.  Patient plans on IUD for contraception.  6) Severe preeclampsia - finish out 24-hr course magnesium sulfate - increase labetalol to 400mg  bid  7) Disposition anticipate PPD2 if stable BP  Malachy Mood, MD, [place], [place] [facility] 09/29/2019, 9:30 AM

## 2019-09-29 NOTE — Anesthesia Postprocedure Evaluation (Signed)
Anesthesia Post Note  Patient: Jillian Phillips  Procedure(s) Performed: AN AD [place]  Patient location during evaluation: Mother Baby Anesthesia Type: Epidural Level of consciousness: awake and alert Pain management: pain level controlled Vital Signs Assessment: post-procedure vital signs reviewed and stable Respiratory status: spontaneous breathing, nonlabored ventilation and respiratory function stable Cardiovascular status: stable Postop Assessment: no headache, no backache and epidural receding Anesthetic complications: no   No complications documented.   Last Vitals:  Vitals:   09/29/19 0731 09/29/19 0736  BP:    Pulse:    Resp:    Temp:    SpO2: 98% 99%    Last Pain:  Vitals:   09/29/19 0730  TempSrc: Axillary  PainSc: 0-No pain                 Akshith Moncus B Clarisa Kindred

## 2019-09-29 NOTE — Lactation Note (Signed)
This note was copied from a baby's chart. Lactation Consultation Note  Patient Name: Jillian Phillips [id number] Date: 09/29/2019    Per RN request, [place] spoke with and answered questions with parents about [facility] enrollment. Provided pamphlet with information and contact number for [facility]. Referral also sent via fax with parents approval. Mom's feeding preference is formula, no desire to BF. Brief education given for drying up her milk supply: well supportive bra, no stimulation, no warmth/use ice, and no removal. Mom had no questions; thankful for information given.   Maternal Data    Feeding Feeding Type: Bottle Fed - Formula  [facility] Score                   Interventions    Lactation Tools Discussed/Used     Consult Status      Jillian Phillips 09/29/2019, 4:50 PM

## 2019-09-30 LAB — CBC
HCT: 23.5 % — ABNORMAL LOW (ref 36.0–46.0)
Hemoglobin: 7.3 g/dL — ABNORMAL LOW (ref 12.0–15.0)
MCH: 27.4 pg (ref 26.0–34.0)
MCHC: 31.1 g/dL (ref 30.0–36.0)
MCV: 88.3 fL (ref 80.0–100.0)
Platelets: 199 10*3/uL (ref 150–400)
RBC: 2.66 MIL/uL — ABNORMAL LOW (ref 3.87–5.11)
RDW: 14.3 % (ref 11.5–15.5)
WBC: 9.7 10*3/uL (ref 4.0–10.5)
nRBC: 0.2 % (ref 0.0–0.2)

## 2019-09-30 LAB — RPR: RPR Ser Ql: NONREACTIVE — AB

## 2019-09-30 MED ORDER — BENZOCAINE-MENTHOL 20-0.5 % EX AERO: 1.0000 "application " | INHALATION_SPRAY | CUTANEOUS | 0 refills | Status: DC | PRN

## 2019-09-30 MED ORDER — IBUPROFEN 600 MG PO TABS: 600.0000 mg | ORAL_TABLET | Freq: Four times a day (QID) | ORAL | 0 refills | Status: DC

## 2019-09-30 MED ORDER — FERROUS SULFATE 325 (65 FE) MG PO TABS: 325.0000 mg | ORAL_TABLET | Freq: Two times a day (BID) | ORAL | 11 refills | Status: DC

## 2019-09-30 MED ORDER — LABETALOL HCL 200 MG PO TABS
600.0000 mg | ORAL_TABLET | Freq: Three times a day (TID) | ORAL | Status: DC
  Administered 2019-09-30: 600 mg via ORAL
  Filled 2019-09-30: qty 3

## 2019-09-30 MED ORDER — NIFEDIPINE 10 MG PO CAPS
10.0000 mg | ORAL_CAPSULE | Freq: Three times a day (TID) | ORAL | Status: DC
  Administered 2019-09-30: 10 mg via ORAL
  Filled 2019-09-30 (×3): qty 1

## 2019-09-30 MED ORDER — LABETALOL HCL 300 MG PO TABS: 600.0000 mg | ORAL_TABLET | Freq: Three times a day (TID) | ORAL | 0 refills | Status: DC

## 2019-09-30 MED ORDER — NIFEDIPINE ER OSMOTIC RELEASE 30 MG PO TB24: 30.0000 mg | ORAL_TABLET | Freq: Every day | ORAL | Status: DC

## 2019-09-30 MED ORDER — NIFEDIPINE 10 MG PO CAPS: 10.0000 mg | ORAL_CAPSULE | Freq: Three times a day (TID) | ORAL | 0 refills | Status: DC

## 2019-09-30 MED ORDER — NIFEDIPINE 10 MG PO CAPS
10.0000 mg | ORAL_CAPSULE | Freq: Three times a day (TID) | ORAL | Status: DC
  Filled 2019-09-30 (×2): qty 1

## 2019-09-30 MED ORDER — LABETALOL HCL 200 MG PO TABS
200.0000 mg | ORAL_TABLET | Freq: Once | ORAL | Status: AC
  Administered 2019-09-30: 200 mg via ORAL
  Filled 2019-09-30: qty 1

## 2019-09-30 NOTE — Progress Notes (Signed)
Discharge order received from doctor. Reviewed discharge instructions and prescriptions with patient and answered all questions. Follow up appointment given. Patient verbalized understanding. ID bands checked. Patient discharged home with infant via wheelchair by nursing/auxillary.    Audree Bane, RN

## 2019-09-30 NOTE — Progress Notes (Signed)
Subjective:  Patient is feeling well. She is anxious to go home.  Pain control is good. Voiding without difficulty. Tolerating a regular diet. Ambulating well. Normal BM.   Objective:   Blood pressure (!) 150/83, pulse 66, temperature 98 F (36.7 C), temperature source Oral, resp. rate 18, height 5\' 9"  (1.753 m), weight 84.8 kg, last menstrual period 01/15/2019, SpO2 99 %, unknown if currently breastfeeding.  General: NAD Pulmonary: no increased work of breathing Abdomen: non-distended, non-tender Uterus:  fundus firm at U; lochia normal Extremities: no edema, no erythema, no tenderness, no signs of DVT  Results for orders placed or performed during the hospital encounter of 09/27/19 (from the past 72 hour(s))  Protein / creatinine ratio, urine     Status: Abnormal   Collection Time: 09/27/19  3:09 PM  Result Value Ref Range   Creatinine, Urine 210 mg/dL   Total Protein, Urine 50 mg/dL    Comment: NO NORMAL RANGE ESTABLISHED FOR THIS TEST   Protein Creatinine Ratio 0.24 (H) 0.00 - 0.15 mg/mg[Cre]    Comment: Performed at [facility], [address], [place], [place] [address]  Urine Drug Screen, Qualitative ([place] only)     Status: Abnormal   Collection Time: 09/27/19  3:09 PM  Result Value Ref Range   Tricyclic, Ur Screen NONE DETECTED NONE DETECTED   Amphetamines, Ur Screen POSITIVE (A) NONE DETECTED   MDMA (Ecstasy)Ur Screen NONE DETECTED NONE DETECTED   Cocaine Metabolite,Ur [place] NONE DETECTED NONE DETECTED   Opiate, Ur Screen NONE DETECTED NONE DETECTED   Phencyclidine (PCP) Ur S NONE DETECTED NONE DETECTED   Cannabinoid 50 Ng, Ur [place] POSITIVE (A) NONE DETECTED   Barbiturates, Ur Screen NONE DETECTED NONE DETECTED   Benzodiazepine, Ur Scrn NONE DETECTED NONE DETECTED   Methadone Scn, Ur NONE DETECTED NONE DETECTED    Comment: (NOTE) Tricyclics + metabolites, urine    Cutoff 1000 ng/mL Amphetamines + metabolites, urine  Cutoff 1000 ng/mL MDMA (Ecstasy), urine               Cutoff 500 ng/mL Cocaine Metabolite, urine          Cutoff 300 ng/mL Opiate + metabolites, urine        Cutoff 300 ng/mL Phencyclidine (PCP), urine         Cutoff 25 ng/mL Cannabinoid, urine                 Cutoff 50 ng/mL Barbiturates + metabolites, urine  Cutoff 200 ng/mL Benzodiazepine, urine              Cutoff 200 ng/mL Methadone, urine                   Cutoff 300 ng/mL  The urine drug screen provides only a preliminary, unconfirmed analytical test result and should not be used for non-medical purposes. Clinical consideration and professional judgment should be applied to any positive drug screen result due to possible interfering substances. A more specific alternate chemical method must be used in order to obtain a confirmed analytical result. Gas chromatography / mass spectrometry (GC/MS) is the preferred confirm atory method. Performed at [facility], [address] [place], [place] [address]   Type and screen [place]     Status: None   Collection Time: 09/27/19  3:19 PM  Result Value Ref Range   ABO/RH(D) A POS    Antibody Screen NEG    Sample Expiration      09/30/2019,2359  Performed at [facility], [address] [place], [place] [address]   CBC     Status: Abnormal   Collection Time: 09/27/19  3:19 PM  Result Value Ref Range   WBC 12.8 (H) 4.0 - 10.5 K/uL   RBC 3.17 (L) 3.87 - 5.11 MIL/uL   Hemoglobin 8.6 (L) 12.0 - 15.0 g/dL   HCT 27.2 (L) 36 - 46 %   MCV 85.8 80.0 - 100.0 fL   MCH 27.1 26.0 - 34.0 pg   MCHC 31.6 30.0 - 36.0 g/dL   RDW 13.6 11.5 - 15.5 %   Platelets 209 150 - 400 K/uL   nRBC 0.0 0.0 - 0.2 %    Comment: Performed at [facility], [address], [place], [place] [address]  Comprehensive metabolic panel     Status: Abnormal   Collection Time: 09/27/19  3:19 PM  Result Value Ref Range   Sodium 135 135 - 145 mmol/L   Potassium 3.9 3.5 - 5.1 mmol/L   Chloride 102 98 - 111  mmol/L   CO2 23 22 - 32 mmol/L   Glucose, Bld 104 (H) 70 - 99 mg/dL    Comment: Glucose reference range applies only to samples taken after fasting for at least 8 hours.   BUN 12 6 - 20 mg/dL   Creatinine, Ser 0.60 0.44 - 1.00 mg/dL   Calcium 9.0 8.9 - 10.3 mg/dL   Total Protein 6.5 6.5 - 8.1 g/dL   Albumin 2.7 (L) 3.5 - 5.0 g/dL   AST 29 15 - 41 U/L   ALT 22 0 - 44 U/L   Alkaline Phosphatase 192 (H) 38 - 126 U/L   Total Bilirubin 0.5 0.3 - 1.2 mg/dL   GFR calc non Af Amer >60 >60 mL/min   GFR calc Af Amer >60 >60 mL/min   Anion gap 10 5 - 15    Comment: Performed at [facility], [address] [place], [place] [address]  RPR     Status: Abnormal   Collection Time: 09/27/19  3:19 PM  Result Value Ref Range   RPR Ser Ql Non Reactive (A) Non Reactive    Comment: (NOTE) Performed At: [facility] [place], [place] [id number] Rush Farmer MD [id number]   Group B strep by PCR     Status: Abnormal   Collection Time: 09/27/19  6:12 PM   Specimen: Nasopharyngeal Swab; Genital  Result Value Ref Range   Group B strep by PCR POSITIVE (A) NEGATIVE    Comment: (NOTE) Intrapartum testing with Xpert GBS assay should be used as an adjunct to other methods available and not used to replace antepartum testing (at 35-[redacted] weeks gestation). Performed at [facility], [address] [place], [place] [address]   SARS Coronavirus 2 by RT PCR (hospital order, performed in [facility] hospital lab) Nasopharyngeal Nasopharyngeal Swab     Status: None   Collection Time: 09/27/19  6:12 PM   Specimen: Nasopharyngeal Swab  Result Value Ref Range   SARS Coronavirus 2 NEGATIVE NEGATIVE    Comment: (NOTE) SARS-CoV-2 target nucleic acids are NOT DETECTED.  The SARS-CoV-2 RNA is generally detectable in upper and lower respiratory specimens during the acute phase of infection. The lowest concentration of SARS-CoV-2 viral copies this assay can detect is  250 copies / mL. A negative result does not preclude SARS-CoV-2 infection and should not be used as the sole basis for treatment or other patient management decisions.  A negative result may occur with improper specimen collection / handling, submission of specimen other than nasopharyngeal swab, presence of viral mutation(s) within the areas targeted by this assay, and inadequate number of viral copies (<250 copies / mL). A negative result must be combined with clinical observations, patient history, and epidemiological information.  Fact Sheet for Patients:   StrictlyIdeas.no  Fact Sheet for Healthcare Providers: BankingDealers.co.za  This test is not yet approved or  cleared by the [place] FDA and has been authorized for detection and/or diagnosis of SARS-CoV-2 by FDA under an Emergency Use Authorization (EUA).  This EUA will remain in effect (meaning this test can be used) for the duration of the COVID-19 declaration under Section 564(b)(1) of the Act, 21 U.S.C. section 360bbb-3(b)(1), unless the authorization is terminated or revoked sooner.  Performed at [facility], [address] [place], [place] [address]   CBC     Status: Abnormal   Collection Time: 09/27/19 11:07 PM  Result Value Ref Range   WBC 15.8 (H) 4.0 - 10.5 K/uL   RBC 3.30 (L) 3.87 - 5.11 MIL/uL   Hemoglobin 9.0 (L) 12.0 - 15.0 g/dL   HCT 28.2 (L) 36 - 46 %   MCV 85.5 80.0 - 100.0 fL   MCH 27.3 26.0 - 34.0 pg   MCHC 31.9 30.0 - 36.0 g/dL   RDW 13.7 11.5 - 15.5 %   Platelets 222 150 - 400 K/uL   nRBC 0.1 0.0 - 0.2 %    Comment: Performed at [facility], [address] [place], [place] [address]  CBC     Status: Abnormal   Collection Time: 09/29/19  6:31 AM  Result Value Ref Range   WBC 15.7 (H) 4.0 - 10.5 K/uL   RBC 2.70 (L) 3.87 - 5.11 MIL/uL   Hemoglobin 7.4 (L) 12.0 - 15.0 g/dL   HCT 23.3 (L) 36 - 46 %   MCV 86.3 80.0 -  100.0 fL   MCH 27.4 26.0 - 34.0 pg   MCHC 31.8 30.0 - 36.0 g/dL   RDW 14.1 11.5 - 15.5 %   Platelets 191 150 - 400 K/uL   nRBC 0.0 0.0 - 0.2 %    Comment: Performed at [facility], [address] [place], [place] [address]  CBC     Status: Abnormal   Collection Time: 09/30/19  6:14 AM  Result Value Ref Range   WBC 9.7 4.0 - 10.5 K/uL   RBC 2.66 (L) 3.87 - 5.11 MIL/uL   Hemoglobin 7.3 (L) 12.0 - 15.0 g/dL   HCT 23.5 (L) 36 - 46 %   MCV 88.3 80.0 - 100.0 fL   MCH 27.4 26.0 - 34.0 pg   MCHC 31.1 30.0 - 36.0 g/dL   RDW 14.3 11.5 - 15.5 %   Platelets 199 150 - 400 K/uL   nRBC 0.2 0.0 - 0.2 %    Comment: Performed at [facility], [address] [place], [place] [address]    Assessment:   39 y.o. [id number] postpartum day # 2  Plan:    1) Acute blood loss anemia - hemodynamically stable and asymptomatic - po ferrous sulfate  2) Blood Type --/--/A POS (08/16 1519)   3) Rubella 13.20 (03/16 1426) / Varicella Immune- TDAP up to date  4) Bottle feeding  5) Contraception- planning OCP with PCP  6) Disposition- pending BP readings throughout today.   7) Preeclampsia- will increase labetalol to 600 mg TID. Add  nifedipine 10 mg every 8 hours  Adrian Prows MD, [place] 09/30/2019 10:05 AM

## 2019-09-30 NOTE — Discharge Instructions (Signed)
Please call your doctor or return to the ER if you experience any chest pains, shortness of breath, dizziness, visual changes, severe headache (unrelieved by pain meds), fever greater than 101, any heavy bleeding (saturating more than 1 pad per hour), large clots, or foul smelling discharge, any worsening abdominal pain and cramping that is not controlled by pain medication, any calf/leg pain or redness, any breast concerns (redness/pain), or any signs of postpartum depression. No tampons, enemas, douches, or sexual intercourse for 6 weeks. Also avoid tub baths, hot tubs, or swimming for 6 weeks.     Preeclampsia and Eclampsia Preeclampsia is a serious condition that may develop during pregnancy. This condition causes high blood pressure and increased protein in your urine along with other symptoms, such as headaches and vision changes. These symptoms may develop as the condition gets worse. Preeclampsia may occur at 20 weeks of pregnancy or later. Diagnosing and treating preeclampsia early is very important. If not treated early, it can cause serious problems for you and your baby. One problem it can lead to is eclampsia. Eclampsia is a condition that causes muscle jerking or shaking (convulsions or seizures) and other serious problems for the mother. During pregnancy, delivering your baby may be the best treatment for preeclampsia or eclampsia. For most women, preeclampsia and eclampsia symptoms go away after giving birth. In rare cases, a woman may develop preeclampsia after giving birth (postpartum preeclampsia). This usually occurs within 48 hours after childbirth but may occur up to 6 weeks after giving birth. What are the causes? The cause of preeclampsia is not known. What increases the risk? The following risk factors make you more likely to develop preeclampsia:  Being pregnant for the first time.  Having had preeclampsia during a past pregnancy.  Having a family history of  preeclampsia.  Having high blood pressure.  Being pregnant with more than one baby.  Being 76 or older.  Being African-American.  Having kidney disease or diabetes.  Having medical conditions such as lupus or blood diseases.  Being very overweight (obese). What are the signs or symptoms? The most common symptoms are:  Severe headaches.  Vision problems, such as blurred or double vision.  Abdominal pain, especially upper abdominal pain. Other symptoms that may develop as the condition gets worse include:  Sudden weight gain.  Sudden swelling of the hands, face, legs, and feet.  Severe nausea and vomiting.  Numbness in the face, arms, legs, and feet.  Dizziness.  Urinating less than usual.  Slurred speech.  Convulsions or seizures. How is this diagnosed? There are no screening tests for preeclampsia. Your health care provider will ask you about symptoms and check for signs of preeclampsia during your prenatal visits. You may also have tests that include:  Checking your blood pressure.  Urine tests to check for protein. Your health care provider will check for this at every prenatal visit.  Blood tests.  Monitoring your baby's heart rate.  Ultrasound. How is this treated? You and your health care provider will determine the treatment approach that is best for you. Treatment may include:  Having more frequent prenatal exams to check for signs of preeclampsia, if you have an increased risk for preeclampsia.  Medicine to lower your blood pressure.  Staying in the hospital, if your condition is severe. There, treatment will focus on controlling your blood pressure and the amount of fluids in your body (fluid retention).  Taking medicine (magnesium sulfate) to prevent seizures. This may be given as an  injection or through an IV.  Taking a low-dose aspirin during your pregnancy.  Delivering your baby early. You may have your labor started with medicine  (induced), or you may have a cesarean delivery. Follow these instructions at home: Eating and drinking   Drink enough fluid to keep your urine pale yellow.  Avoid caffeine. Lifestyle  Do not use any products that contain nicotine or tobacco, such as cigarettes and e-cigarettes. If you need help quitting, ask your health care provider.  Do not use alcohol or drugs.  Avoid stress as much as possible. Rest and get plenty of sleep. General instructions  Take over-the-counter and prescription medicines only as told by your health care provider.  When lying down, lie on your left side. This keeps pressure off your major blood vessels.  When sitting or lying down, raise (elevate) your feet. Try putting some pillows underneath your lower legs.  Exercise regularly. Ask your health care provider what kinds of exercise are best for you.  Keep all follow-up and prenatal visits as told by your health care provider. This is important. How is this prevented? There is no known way of preventing preeclampsia or eclampsia from developing. However, to lower your risk of complications and detect problems early:  Get regular prenatal care. Your health care provider may be able to diagnose and treat the condition early.  Maintain a healthy weight. Ask your health care provider for help managing weight gain during pregnancy.  Work with your health care provider to manage any long-term (chronic) health conditions you have, such as diabetes or kidney problems.  You may have tests of your blood pressure and kidney function after giving birth.  Your health care provider may have you take low-dose aspirin during your next pregnancy. Contact a health care provider if:  You have symptoms that your health care provider told you may require more treatment or monitoring, such as: ? Headaches. ? Nausea or vomiting. ? Abdominal pain. ? Dizziness. ? Light-headedness. Get help right away if:  You have  severe: ? Abdominal pain. ? Headaches that do not get better. ? Dizziness. ? Vision problems. ? Confusion. ? Nausea or vomiting.  You have any of the following: ? A seizure. ? Sudden, rapid weight gain. ? Sudden swelling in your hands, ankles, or face. ? Trouble moving any part of your body. ? Numbness in any part of your body. ? Trouble speaking. ? Abnormal bleeding.  You faint. Summary  Preeclampsia is a serious condition that may develop during pregnancy.  This condition causes high blood pressure and increased protein in your urine along with other symptoms, such as headaches and vision changes.  Diagnosing and treating preeclampsia early is very important. If not treated early, it can cause serious problems for you and your baby.  Get help right away if you have symptoms that your health care provider told you to watch for. This information is not intended to replace advice given to you by your health care provider. Make sure you discuss any questions you have with your health care provider. Document Revised: 09/30/2017 Document Reviewed: 09/04/2015 Elsevier Patient Education  [place].

## 2019-10-01 NOTE — TOC Initial Note (Signed)
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 FOB works from home and will be providing care for infant while mother works. MOB has transportation and support for herself and infant to and from appointments. No history of PPD although patient does have history of anxiety and depression managed with PCP. Infant has follow up appointment following day at [facility] and [facility] has scheduled appointment for her follow up as well. Reviewed PPD and SIDS with MOB as well as importance of not allowing smoking around infant. Discussed importance of hand hygiene. Infant will be bottlefeeding only. Patient states she has no current needs or concerns and is eager to discharge home.         Patient Goals and CMS Choice        Expected Discharge Plan and Services           Expected Discharge Date: 09/30/19                                    Prior Living Arrangements/Services                       Activities of Daily Living Home Assistive Devices/Equipment: None ADL Screening (condition at time of admission) Patient's cognitive ability adequate to safely complete daily activities?: Yes Is the patient deaf or have difficulty hearing?: No Does the patient have difficulty seeing, even when wearing glasses/contacts?: No Does the patient have difficulty concentrating, remembering, or making decisions?: No Patient able to express need for assistance with ADLs?: Yes Does the patient have difficulty dressing or bathing?: No Independently performs ADLs?: Yes (appropriate for developmental age) Does the patient have  difficulty walking or climbing stairs?: No Weakness of Legs: None Weakness of Arms/Hands: None  Permission Sought/Granted                  Emotional Assessment              Admission diagnosis:  Elevated blood pressure affecting pregnancy in third trimester, antepartum [O16.3] Chronic hypertension with superimposed preeclampsia [O11.9] Patient Active Problem List   Diagnosis Date Noted  . Elevated blood pressure affecting pregnancy in third trimester, antepartum 09/27/2019  . Chronic hypertension with superimposed preeclampsia 09/27/2019  . Hypertension affecting pregnancy, third trimester 09/02/2019  . Abdominal pain in pregnancy, antepartum 06/30/2019  . Low-lying placenta in second trimester 06/06/2019  . GBS bacteriuria 05/04/2019  . History of prior pregnancy with IUGR newborn 04/27/2019  . History of hypothyroidism 04/27/2019  . History of substance abuse ([place]) 04/27/2019  . Chronic viral hepatitis complicating pregnancy ([place]) 04/27/2019  . Antepartum multigravida of advanced maternal age 88/16/2021  . Supervision of high risk pregnancy, antepartum 04/27/2019  . Genital herpes affecting pregnancy, antepartum 04/27/2019   PCP:  Remi Haggard, FNP Pharmacy:   [id number] - [place], [place] - [address] [place] [address] Phone: [phone] Fax: [phone]     Social  Determinants of Health (SDOH) Interventions    Readmission Risk Interventions No flowsheet data found.

## 2019-10-04 ENCOUNTER — Ambulatory Visit: Payer: Medicaid Other | Admitting: Obstetrics and Gynecology

## 2019-10-05 ENCOUNTER — Ambulatory Visit (INDEPENDENT_AMBULATORY_CARE_PROVIDER_SITE_OTHER): Payer: Medicaid Other | Admitting: Dermatology

## 2019-10-05 ENCOUNTER — Other Ambulatory Visit: Payer: Self-pay

## 2019-10-05 ENCOUNTER — Telehealth: Payer: Self-pay

## 2019-10-05 DIAGNOSIS — L7211 Pilar cyst: Secondary | ICD-10-CM

## 2019-10-05 DIAGNOSIS — D492 Neoplasm of unspecified behavior of bone, soft tissue, and skin: Secondary | ICD-10-CM

## 2019-10-05 MED ORDER — MUPIROCIN 2 % EX OINT: 1.0000 "application " | TOPICAL_OINTMENT | Freq: Every day | CUTANEOUS | 1 refills | Status: DC

## 2019-10-05 NOTE — Telephone Encounter (Signed)
Pt doing well after todays surgery./sh 

## 2019-10-05 NOTE — Patient Instructions (Signed)

## 2019-10-05 NOTE — Progress Notes (Signed)
   Follow-Up Visit   Subjective  Jillian Phillips is a 39 y.o. female who presents for the following: Cyst (x 3 scalp, patient presents for surgery of one of cyst).  The following portions of the chart were reviewed this encounter and updated as appropriate:  Tobacco  Allergies  Meds  Problems  Med Hx  Surg Hx  Fam Hx     Review of Systems:  No other skin or systemic complaints except as noted in HPI or Assessment and Plan.  Objective  Well appearing patient in no apparent distress; mood and affect are within normal limits.  A focused examination was performed including scalp. Relevant physical exam findings are noted in the Assessment and Plan.  Objective  R crown scalp: Cystic pap 2.5cm   Assessment & Plan  Neoplasm of skin R crown scalp  mupirocin ointment (BACTROBAN) 2 %  Skin excision  Lesion length (cm):  2.5 Lesion width (cm):  2.5 Margin per side (cm):  0 Total excision diameter (cm):  2.5 Informed consent: discussed and consent obtained   Timeout: patient name, date of birth, surgical site, and procedure verified   Procedure prep:  Patient was prepped and draped in usual sterile fashion Prep type:  Isopropyl alcohol and povidone-iodine Anesthesia: the lesion was anesthetized in a standard fashion   Anesthesia comment:  6.0cc Anesthetic:  1% lidocaine w/ epinephrine 1-100,000 buffered w/ 8.4% NaHCO3 Instrument used: #15 blade   Hemostasis achieved with: pressure   Outcome: patient tolerated procedure well with no complications   Post-procedure details: sterile dressing applied and wound care instructions given   Dressing type: bandage and pressure dressing (Mupirocin)    Skin repair Complexity:  Complex Final length (cm):  2 Reason for type of repair: reduce tension to allow closure, reduce the risk of dehiscence, infection, and necrosis, reduce subcutaneous dead space and avoid a hematoma, allow closure of the large defect, preserve normal anatomy,  preserve normal anatomical and functional relationships and enhance both functionality and cosmetic results   Undermining: area extensively undermined   Undermining comment:  Undermining Defect 2.5cm Subcutaneous layers (deep stitches):  Suture size:  4-0 Suture type: Vicryl (polyglactin 910)   Subcutaneous suture technique: Inverted Dermal. Fine/surface layer approximation (top stitches):  Suture size:  3-0 Suture type: nylon   Stitches: horizontal mattress   Stitches comment:  Nylon Suture removal (days):  7 Hemostasis achieved with: suture and pressure Outcome: patient tolerated procedure well with no complications   Post-procedure details: sterile dressing applied and wound care instructions given   Dressing type: bandage and pressure dressing (Mupirocin)   Additional details:  X 3  Specimen 1 - Surgical pathology Differential Diagnosis: D48.5 Cyst vs other Check Margins: yes Cystic pap 2.5cm  Symptomatic Cyst vs other  Return in about 1 week (around 10/12/2019).  I, Othelia Pulling, RMA, am acting as scribe for Jillian Ser, MD .  Documentation: I have reviewed the above documentation for accuracy and completeness, and I agree with the above.  Jillian Ser, MD

## 2019-10-07 ENCOUNTER — Ambulatory Visit: Payer: Medicaid Other | Admitting: Obstetrics and Gynecology

## 2019-10-08 ENCOUNTER — Telehealth: Payer: Self-pay | Admitting: Obstetrics and Gynecology

## 2019-10-08 NOTE — Telephone Encounter (Signed)
Patient is schedule for 6 week PP on 11/12/19 with SDJ . Possible paraguard placement

## 2019-10-11 NOTE — Telephone Encounter (Signed)
Noted. Will order to arrive by apt date/time. 

## 2019-10-12 ENCOUNTER — Other Ambulatory Visit: Payer: Self-pay

## 2019-10-12 ENCOUNTER — Encounter: Payer: Self-pay | Admitting: Dermatology

## 2019-10-12 ENCOUNTER — Ambulatory Visit (INDEPENDENT_AMBULATORY_CARE_PROVIDER_SITE_OTHER): Payer: Medicaid Other | Admitting: Dermatology

## 2019-10-12 DIAGNOSIS — Z4802 Encounter for removal of sutures: Secondary | ICD-10-CM

## 2019-10-12 DIAGNOSIS — L7211 Pilar cyst: Secondary | ICD-10-CM

## 2019-10-12 NOTE — Progress Notes (Signed)
   Follow-Up Visit   Subjective  Jillian Phillips is a 39 y.o. female who presents for the following: Suture / Staple Removal (Right crown scalp, pilar cyst, benign).  The following portions of the chart were reviewed this encounter and updated as appropriate: Tobacco  Allergies  Meds  Problems  Med Hx  Surg Hx  Fam Hx     Review of Systems: No other skin or systemic complaints except as noted in HPI or Assessment and Plan.  Objective  Well appearing patient in no apparent distress; mood and affect are within normal limits.  A focused examination was performed including scalp. Relevant physical exam findings are noted in the Assessment and Plan.  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  - Discussed pathology results showing benign cyst.  - Patient advised to keep steri-strips dry until they fall off. - Scars remodel for a full year. - Once steri-strips fall off, patient can apply over-the-counter silicone scar cream each night to help with scar remodeling if desired. - Patient advised to call with any concerns or if they notice any new or changing lesions.   Patient will call to schedule excisions for remaining cysts.   Return if symptoms worsen or fail to improve.   IHarriett Sine, CMA, am acting as scribe for Sarina Ser, MD.  Documentation: I have reviewed the above documentation for accuracy and completeness, and I agree with the above.  Sarina Ser, MD

## 2019-10-12 NOTE — Patient Instructions (Signed)

## 2019-10-16 ENCOUNTER — Encounter: Payer: Self-pay | Admitting: Dermatology

## 2019-10-25 ENCOUNTER — Encounter: Payer: Self-pay | Admitting: Obstetrics and Gynecology

## 2019-10-25 ENCOUNTER — Ambulatory Visit (INDEPENDENT_AMBULATORY_CARE_PROVIDER_SITE_OTHER): Payer: Medicaid Other | Admitting: Obstetrics and Gynecology

## 2019-10-25 ENCOUNTER — Other Ambulatory Visit: Payer: Self-pay

## 2019-10-25 DIAGNOSIS — O119 Pre-existing hypertension with pre-eclampsia, unspecified trimester: Secondary | ICD-10-CM

## 2019-10-25 NOTE — Progress Notes (Signed)
Obstetrics & Gynecology Office Visit    Chief Complaint  Patient presents with   Postpartum Care    History of Present Illness: 39 y.o. [phone] female who is 4 weeks postpartum from an SVD. The delivery was uncomplicated. However, she has a history of chronic hypertension and had superimposed severe preeclampsia.  She was discharged on labetalol 300 mg bid and nifedipine 10 mg tid. She also has a history of anxiety and depression.   From a blood pressure standpoint, she has not been taking the medication. She denies any associated symptoms of high blood pressure.  She denies any mood symptoms. She has continued to have some bleeding. Nothing heavy. Denies fevers and chills.     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  Obstetric History: G3P2103  Family History  Problem Relation Age of Onset   Hypertension Mother    Heart disease Mother        arrhythmia   Hypertension Father    Autism Son    Cancer Neg Hx    Diabetes Neg Hx     Social History   Socioeconomic History   Marital status: Single    Spouse name: Not on file   Number of children: Not on file   Years of education: Not on file   Highest education level: Not on file  Occupational History   Not on file  Tobacco Use   Smoking status: Current Every Day Smoker    Packs/day: 0.25    Types: Cigarettes   Smokeless tobacco: Never Used  Vaping Use   Vaping Use: Never used  Substance and Sexual Activity   Alcohol use: No   Drug use: Not Currently   Sexual activity: Yes    Comment: undecided  Other Topics Concern   Not on file  Social History Narrative   Not on file    Social Determinants of Health   Financial Resource Strain:    Difficulty of Paying Living Expenses: Not on file  Food Insecurity:    Worried About Running Out of Food in the Last Year: Not on file   Ran Out of Food in the Last Year: Not on file  Transportation Needs:    Lack of Transportation (Medical): Not on file   Lack of Transportation (Non-Medical): Not on file  Physical Activity:    Days of Exercise per Week: Not on file   Minutes of Exercise per Session: Not on file  Stress:    Feeling of Stress : Not on file  Social Connections:    Frequency of Communication with Friends and Family: Not on file   Frequency of Social Gatherings with Friends and Family: Not on file   Attends Religious Services: Not on file   Active Member of Clubs or Organizations: Not on file   Attends Archivist Meetings: Not on file   Marital Status: Not on file  Intimate Partner Violence:    Fear of Current or Ex-Partner: Not on file   Emotionally Abused: Not on file   Physically Abused: Not on file   Sexually Abused: Not  on file    No Known Allergies  Prior to Admission medications   Medication Sig Start Date End Date Taking? Authorizing Provider  amphetamine-dextroamphetamine (ADDERALL) 30 MG tablet Take 30 mg by mouth daily.   Yes [provider]  ARIPiprazole (ABILIFY) 10 MG tablet Take 10 mg by mouth daily.   Yes [provider]  venlafaxine XR (EFFEXOR-XR) 150 MG 24 hr capsule Take 150 mg by mouth daily with breakfast.   Yes [provider]    Review of Systems  Constitutional: Negative.   HENT: Negative.   Eyes: Negative.   Respiratory: Negative.   Cardiovascular: Negative.   Gastrointestinal: Negative.   Genitourinary: Negative.   Musculoskeletal: Negative.   Skin: Negative.   Neurological: Negative.   Psychiatric/Behavioral: Negative.      Physical Exam BP 126/84    Ht 5\' 8"  (1.727 m)    Wt 176 lb (79.8 kg)    BMI 26.76  kg/m  No LMP recorded. Physical Exam Constitutional:      General: She is not in acute distress.    Appearance: Normal appearance.  HENT:     Head: Normocephalic and atraumatic.  Eyes:     General: No scleral icterus.    Conjunctiva/sclera: Conjunctivae normal.  Neurological:     General: No focal deficit present.     Mental Status: She is alert and oriented to person, place, and time.     Cranial Nerves: No cranial nerve deficit.  Psychiatric:        Mood and Affect: Mood normal.        Behavior: Behavior normal.        Judgment: Judgment normal.     Female chaperone present for pelvic and breast  portions of the physical exam  Assessment: 39 y.o. [id number] female here for  1. Postpartum care and examination   2. Chronic hypertension with superimposed preeclampsia      Plan: Problem List Items Addressed This Visit      Cardiovascular and Mediastinum   Chronic hypertension with superimposed preeclampsia    Other Visit Diagnoses    Postpartum care and examination    -  Primary     Continue current care.  Follow up 2 weeks for 6 weeks pp and Paragard IUD placement.  Prentice Docker, MD 10/25/2019 12:13 PM

## 2019-11-12 ENCOUNTER — Encounter: Payer: Self-pay | Admitting: Obstetrics and Gynecology

## 2019-11-12 ENCOUNTER — Other Ambulatory Visit: Payer: Self-pay

## 2019-11-12 ENCOUNTER — Other Ambulatory Visit (HOSPITAL_COMMUNITY)
Admission: RE | Admit: 2019-11-12 | Discharge: 2019-11-12 | Disposition: A | Discharge status: Home / Self Care | Payer: Medicaid Other | Source: Ambulatory Visit | Attending: Obstetrics and Gynecology | Admitting: Obstetrics and Gynecology

## 2019-11-12 ENCOUNTER — Ambulatory Visit (INDEPENDENT_AMBULATORY_CARE_PROVIDER_SITE_OTHER): Payer: Medicaid Other | Admitting: Obstetrics and Gynecology

## 2019-11-12 DIAGNOSIS — Z124 Encounter for screening for malignant neoplasm of cervix: Secondary | ICD-10-CM | POA: Diagnosis present

## 2019-11-12 DIAGNOSIS — Z30430 Encounter for insertion of intrauterine contraceptive device: Secondary | ICD-10-CM

## 2019-11-12 MED ORDER — PARAGARD INTRAUTERINE COPPER IU IUD: 1.0000 | INTRAUTERINE_SYSTEM | Freq: Once | INTRAUTERINE | 0 refills | Status: DC

## 2019-11-12 NOTE — Progress Notes (Signed)
Postpartum Visit   Chief Complaint  Patient presents with  . Postpartum Care   History of Present Illness: Patient is a 39 y.o. [id number] presents for postpartum visit.  Date of delivery: 09/28/2019 Type of delivery: Vaginal delivery - Vacuum or forceps assisted  no Episiotomy No.  Laceration: no  Pregnancy or labor problems:  Chronic hypertension, AMA, HSV history Any problems since the delivery:  no  Newborn Details:  SINGLETON :  1. Baby's name: Glennon Mac!!. Birth weight: 2,540 grams Maternal Details:  Breast Feeding:  no Post partum depression/anxiety noted:  no Edinburgh Post-Partum Depression Score:  0  Date of last PAP: ?03/2019  normal   Past Medical History:  Diagnosis Date  . ADD (attention deficit disorder)   . Anxiety   . Chronic hepatitis C ([place])   . Depression   . Genital HSV   . 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 Marijuana use     Past Surgical History:  Procedure Laterality Date  . NO PAST SURGERIES      Prior to Admission medications   Medication Sig Start Date End Date Taking? Authorizing Provider  amphetamine-dextroamphetamine (ADDERALL) 30 MG tablet Take 30 mg by mouth daily.   Yes [provider]  ARIPiprazole (ABILIFY) 10 MG tablet Take 10 mg by mouth daily.   Yes [provider]  venlafaxine XR (EFFEXOR-XR) 150 MG 24 hr capsule Take 150 mg by mouth daily with breakfast.   Yes [provider]  benzocaine-Menthol (DERMOPLAST) 20-0.5 % AERO Apply 1 application topically as needed for irritation (perineal discomfort). Patient not taking: Reported on 10/25/2019 09/30/19   Homero Fellers, MD  clindamycin (CLEOCIN-T) 1 % lotion May use daily on face pending OB clearance Patient not taking: Reported on 09/27/2019 08/30/19   Ralene Bathe, MD  ferrous sulfate 325 (65 FE) MG tablet Take 1 tablet (325 mg total) by mouth 2 (two) times daily with a meal. Patient not taking: Reported on  10/25/2019 09/30/19   Homero Fellers, MD  ibuprofen (ADVIL) 600 MG tablet Take 1 tablet (600 mg total) by mouth every 6 (six) hours. Patient not taking: Reported on 10/25/2019 09/30/19   Homero Fellers, MD  labetalol (NORMODYNE) 300 MG tablet Take 2 tablets (600 mg total) by mouth 3 (three) times daily. Patient not taking: Reported on 10/25/2019 09/30/19   Homero Fellers, MD  mupirocin ointment (BACTROBAN) 2 % Apply 1 application topically daily. Qd to excision site Patient not taking: Reported on 10/25/2019 10/05/19   Ralene Bathe, MD  NIFEdipine (PROCARDIA) 10 MG capsule Take 1 capsule (10 mg total) by mouth 3 (three) times daily. Patient not taking: Reported on 10/25/2019 09/30/19   Homero Fellers, MD    No Known Allergies   Social History   Socioeconomic History  . Marital status: Single    Spouse name: Not on file  . Number of children: Not on file  . Years of education: Not on file  . Highest education level: Not on file  Occupational History  . Not on file  Tobacco Use  . Smoking status: Current Every Day Smoker    Packs/day: 0.25    Types: Cigarettes  . Smokeless tobacco: Never Used  Vaping Use  . Vaping Use: Never used  Substance and Sexual Activity  . Alcohol use: No  . Drug use: Not Currently  . Sexual activity: Yes    Comment: undecided  Other Topics Concern  .  Not on file  Social History Narrative  . Not on file   Social Determinants of Health   Financial Resource Strain:   . Difficulty of Paying Living Expenses: Not on file  Food Insecurity:   . Worried About Charity fundraiser in the Last Year: Not on file  . Ran Out of Food in the Last Year: Not on file  Transportation Needs:   . Lack of Transportation (Medical): Not on file  . Lack of Transportation (Non-Medical): Not on file  Physical Activity:   . Days of Exercise per Week: Not on file  . Minutes of Exercise per Session: Not on file  Stress:   . Feeling of Stress : Not  on file  Social Connections:   . Frequency of Communication with Friends and Family: Not on file  . Frequency of Social Gatherings with Friends and Family: Not on file  . Attends Religious Services: Not on file  . Active Member of Clubs or Organizations: Not on file  . Attends Archivist Meetings: Not on file  . Marital Status: Not on file  Intimate Partner Violence:   . Fear of Current or Ex-Partner: Not on file  . Emotionally Abused: Not on file  . Physically Abused: Not on file  . Sexually Abused: Not on file    Family History  Problem Relation Age of Onset  . Hypertension Mother   . Heart disease Mother        arrhythmia  . Hypertension Father   . Autism Son   . Cancer Neg Hx   . Diabetes Neg Hx     Review of Systems  Constitutional: Negative.   HENT: Negative.   Eyes: Negative.   Respiratory: Negative.   Cardiovascular: Negative.   Gastrointestinal: Negative.   Genitourinary: Negative.   Musculoskeletal: Negative.   Skin: Negative.   Neurological: Negative.   Psychiatric/Behavioral: Negative.      Physical Exam BP 120/80   Ht 5\' 8"  (1.727 m)   Wt 176 lb (79.8 kg)   BMI 26.76 kg/m   Physical Exam Constitutional:      General: She is not in acute distress.    Appearance: Normal appearance. She is well-developed.  Genitourinary:     Pelvic exam was performed with patient in the lithotomy position.     Vulva, inguinal canal, urethra, bladder, vagina, cervix, uterus, right adnexa and left adnexa normal.     Uterus is anteverted.  HENT:     Head: Normocephalic and atraumatic.  Eyes:     General: No scleral icterus.    Conjunctiva/sclera: Conjunctivae normal.  Cardiovascular:     Rate and Rhythm: Normal rate and regular rhythm.     Heart sounds: No murmur heard.  No friction rub. No gallop.   Pulmonary:     Effort: Pulmonary effort is normal. No respiratory distress.     Breath sounds: Normal breath sounds. No wheezing or rales.  Abdominal:      General: Bowel sounds are normal. There is no distension.     Palpations: Abdomen is soft. There is no mass.     Tenderness: There is no abdominal tenderness. There is no guarding or rebound.  Musculoskeletal:        General: Normal range of motion.     Cervical back: Normal range of motion and neck supple.  Neurological:     General: No focal deficit present.     Mental Status: She is alert and oriented to person, place,  and time.     Cranial Nerves: No cranial nerve deficit.  Skin:    General: Skin is warm and dry.     Findings: No erythema.  Psychiatric:        Mood and Affect: Mood normal.        Behavior: Behavior normal.        Judgment: Judgment normal.    IUD Insertion Procedure Note (Paragard) Patient identified, informed consent performed, consent signed.   Discussed risks of irregular bleeding, cramping, infection, malpositioning, expulsion or uterine perforation of the IUD (1:1000 placements)  which may require further procedure such as laparoscopy.  IUD while effective at preventing pregnancy do not prevent transmission of sexually transmitted diseases and use of barrier methods for this purpose was discussed. Time out was performed.  Urine pregnancy test negative.  Speculum placed in the vagina.  Cervix visualized.  Cleaned with Betadine x 2.  Grasped anteriorly with a single tooth tenaculum.  Uterus sounded to 8 cm. IUD placed per manufacturer's recommendations.  Strings trimmed to 3 cm. Tenaculum was removed, good hemostasis noted.  Patient tolerated procedure well.   Patient was given post-procedure instructions.  She was advised to have backup contraception for one week.  Patient was also asked to check IUD strings periodically and follow up in 4 weeks for IUD check.   Female Chaperone present during breast and/or pelvic exam.  Assessment: 39 y.o. [phone] presenting for 6 week postpartum visit  Plan: Problem List Items Addressed This Visit    None    Visit  Diagnoses    Postpartum care and examination    -  Primary   Relevant Medications   paragard intrauterine copper IUD IUD   Other Relevant Orders   Cytology - PAP   Encounter for IUD insertion       Relevant Medications   paragard intrauterine copper IUD IUD   Pap smear for cervical cancer screening       Relevant Orders   Cytology - PAP      1) Contraception Education given regarding options for contraception, including IUD placement.  2)  Pap - ASCCP guidelines and rational discussed.  Patient opts for routine screening interval  3) Patient underwent screening for postpartum depression with no concerns noted. She continues to take her medication  Return in about 4 weeks (around 12/10/2019) for IUD String Check.   Prentice Docker, MD 11/12/2019 11:09 AM

## 2019-11-15 LAB — CYTOLOGY - PAP
Chlamydia: NEGATIVE
Comment: NEGATIVE
Comment: NEGATIVE
Comment: NORMAL
Diagnosis: NEGATIVE
High risk HPV: NEGATIVE
Neisseria Gonorrhea: NEGATIVE

## 2019-12-14 ENCOUNTER — Ambulatory Visit (INDEPENDENT_AMBULATORY_CARE_PROVIDER_SITE_OTHER): Payer: Medicaid Other | Admitting: Dermatology

## 2019-12-14 ENCOUNTER — Encounter: Payer: Self-pay | Admitting: Dermatology

## 2019-12-14 ENCOUNTER — Telehealth: Payer: Self-pay

## 2019-12-14 ENCOUNTER — Other Ambulatory Visit: Payer: Self-pay

## 2019-12-14 DIAGNOSIS — L720 Epidermal cyst: Secondary | ICD-10-CM

## 2019-12-14 NOTE — Patient Instructions (Signed)

## 2019-12-14 NOTE — Telephone Encounter (Signed)
Pt doing well after today's appointment./sh

## 2019-12-14 NOTE — Progress Notes (Signed)
   Follow-Up Visit   Subjective  Jillian Phillips is a 39 y.o. female who presents for the following: Cyst (R crown scalp, pt presents for excision).  The following portions of the chart were reviewed this encounter and updated as appropriate:  Tobacco  Allergies  Meds  Problems  Med Hx  Surg Hx  Fam Hx     Review of Systems:  No other skin or systemic complaints except as noted in HPI or Assessment and Plan.  Objective  Well appearing patient in no apparent distress; mood and affect are within normal limits.  A focused examination was performed including scalp. Relevant physical exam findings are noted in the Assessment and Plan.  Objective  R crown scalp: Cystic pap 1.5cm   Assessment & Plan  Epidermal cyst R crown scalp  Cyst vs other, excised today  Start Mupirocin oint qd to wound (pt has script at home)  Skin excision - R crown scalp  Lesion length (cm):  1.5 Lesion width (cm):  1.5 Margin per side (cm):  0 Total excision diameter (cm):  1.5 Informed consent: discussed and consent obtained   Timeout: patient name, date of birth, surgical site, and procedure verified   Procedure prep:  Patient was prepped and draped in usual sterile fashion Prep type:  Isopropyl alcohol and povidone-iodine Anesthesia: the lesion was anesthetized in a standard fashion   Anesthetic:  1% lidocaine w/ epinephrine 1-100,000 buffered w/ 8.4% NaHCO3 (3.0) Instrument used comment:  15c blade Hemostasis achieved with: pressure   Hemostasis achieved with comment:  Electrocautery Outcome: patient tolerated procedure well with no complications   Post-procedure details: sterile dressing applied and wound care instructions given   Dressing type: bandage and pressure dressing (Mupirocin)    Skin repair - R crown scalp Complexity:  Complex Final length (cm):  1.5 Reason for type of repair: reduce tension to allow closure, reduce the risk of dehiscence, infection, and necrosis, reduce  subcutaneous dead space and avoid a hematoma, allow closure of the large defect, preserve normal anatomy, preserve normal anatomical and functional relationships and enhance both functionality and cosmetic results   Undermining: area extensively undermined   Undermining comment:  Undermining Defect 1.5cm Subcutaneous layers (deep stitches):  Suture size:  4-0 Suture type: Vicryl (polyglactin 910)   Subcutaneous suture technique: Inverted Dermal. Fine/surface layer approximation (top stitches):  Suture size:  3-0 Suture type: nylon   Stitches: simple running   Suture removal (days):  7 Hemostasis achieved with: pressure Outcome: patient tolerated procedure well with no complications   Post-procedure details: sterile dressing applied and wound care instructions given   Dressing type: bandage, pressure dressing and bacitracin (Mupirocin)    Specimen 1 - Surgical pathology Differential Diagnosis: D48.5 Cyst vs other Check Margins: No Cystic pap 1.5cm  Return in about 1 week (around 12/21/2019) for suture removal.   I, Othelia Pulling, RMA, am acting as scribe for Sarina Ser, MD .  Documentation: I have reviewed the above documentation for accuracy and completeness, and I agree with the above.  Sarina Ser, MD

## 2019-12-14 NOTE — Telephone Encounter (Signed)
Tried to call patient regarding surgery. No answer and no voicemail/hd

## 2019-12-21 ENCOUNTER — Telehealth: Payer: Self-pay

## 2019-12-21 ENCOUNTER — Ambulatory Visit: Payer: Medicaid Other | Admitting: Dermatology

## 2019-12-21 NOTE — Telephone Encounter (Signed)
Patient informed of pathology results and states that she will come in Thursday to have her sutures removed. I advised her that since she is a walk-in she may have to wait.

## 2019-12-21 NOTE — Telephone Encounter (Signed)
-----   Message from Ralene Bathe, MD sent at 12/21/2019 10:06 AM EST ----- Diagnosis Skin (M), right crown scalp PILAR CYST  Benign cyst

## 2019-12-23 ENCOUNTER — Ambulatory Visit: Payer: Medicaid Other | Admitting: Obstetrics and Gynecology

## 2020-02-15 ENCOUNTER — Encounter: Payer: Medicaid Other | Admitting: Dermatology

## 2020-05-30 ENCOUNTER — Other Ambulatory Visit: Payer: Self-pay

## 2020-05-30 ENCOUNTER — Ambulatory Visit (INDEPENDENT_AMBULATORY_CARE_PROVIDER_SITE_OTHER): Payer: Medicaid Other | Admitting: Dermatology

## 2020-05-30 DIAGNOSIS — L7211 Pilar cyst: Secondary | ICD-10-CM | POA: Diagnosis not present

## 2020-05-30 NOTE — Progress Notes (Signed)
   Follow-Up Visit   Subjective  Jillian Phillips is a 40 y.o. female who presents for the following: Cyst (Excise today).  The following portions of the chart were reviewed this encounter and updated as appropriate:   Tobacco  Allergies  Meds  Problems  Med Hx  Surg Hx  Fam Hx     Review of Systems:  No other skin or systemic complaints except as noted in HPI or Assessment and Plan.  Objective  Well appearing patient in no apparent distress; mood and affect are within normal limits.  A focused examination was performed including scalp. Relevant physical exam findings are noted in the Assessment and Plan.  Objective  Left post scalp: Subcutaneous nodule 1.5 cm   Assessment & Plan  Pilar cyst Left post scalp  Skin excision - Left post scalp  Lesion length (cm):  1.5 Lesion width (cm):  1.5 Margin per side (cm):  0 Total excision diameter (cm):  1.5 Informed consent: discussed and consent obtained   Timeout: patient name, date of birth, surgical site, and procedure verified   Procedure prep:  Patient was prepped and draped in usual sterile fashion Prep type:  Isopropyl alcohol and povidone-iodine Anesthesia: the lesion was anesthetized in a standard fashion   Anesthetic:  1% lidocaine w/ epinephrine 1-100,000 buffered w/ 8.4% NaHCO3 Instrument used: #15 blade   Hemostasis achieved with: pressure   Hemostasis achieved with comment:  Electrocautery Outcome: patient tolerated procedure well with no complications   Post-procedure details: sterile dressing applied and wound care instructions given   Dressing type: bandage and pressure dressing (mupirocin)    Skin repair - Left post scalp Complexity:  Complex Final length (cm):  1.5 Reason for type of repair: reduce tension to allow closure, reduce the risk of dehiscence, infection, and necrosis, reduce subcutaneous dead space and avoid a hematoma, allow closure of the large defect, preserve normal anatomy, preserve  normal anatomical and functional relationships and enhance both functionality and cosmetic results   Undermining: area extensively undermined   Undermining comment:  Undermining defect 1.5 Subcutaneous layers (deep stitches):  Suture size:  4-0 Suture type: Vicryl (polyglactin 910)   Subcutaneous suture technique: inverted dermal. Fine/surface layer approximation (top stitches):  Suture size:  3-0 Suture type: nylon   Stitches: simple running   Suture removal (days):  7 Hemostasis achieved with: suture and pressure Outcome: patient tolerated procedure well with no complications   Post-procedure details: sterile dressing applied and wound care instructions given   Dressing type: bandage and pressure dressing (mupirocin)   Additional details:  Apply mupirocin daily - patient has at home  Specimen 1 - Surgical pathology Differential Diagnosis: Cyst vs other  Check Margins: No Subcutaneous nodule 1.5 cm  Return in about 1 week (around 06/06/2020) for suture removal.  I, Ashok Cordia, CMA, am acting as scribe for Sarina Ser, MD .  Documentation: I have reviewed the above documentation for accuracy and completeness, and I agree with the above.  Sarina Ser, MD

## 2020-05-30 NOTE — Patient Instructions (Signed)
If you have any questions or concerns for your doctor, please call our main line at [phone] and press option 4 to reach your doctor's medical assistant. If no one answers, please leave a voicemail as directed and we will return your call as soon as possible. Messages left after 4 pm will be answered the following business day.   You may also send us a message via MyChart. We typically respond to MyChart messages within 1-2 business days.  For prescription refills, please ask your pharmacy to contact our office. Our fax number is [phone].  If you have an urgent issue when the clinic is closed that cannot wait until the next business day, you can page your doctor at the number below.    Please note that while we do our best to be available for urgent issues outside of office hours, we are not available 24/7.   If you have an urgent issue and are unable to reach us, you may choose to seek medical care at your doctor's office, retail clinic, urgent care center, or emergency room.  If you have a medical emergency, please immediately call 911 or go to the emergency department.  Pager Numbers  - Dr. Kowalski: [phone]  - Dr. Moye: [phone]  - Dr. Stewart: [phone]  In the event of inclement weather, please call our main line at [phone] for an update on the status of any delays or closures.  Dermatology Medication Tips: Please keep the boxes that topical medications come in in order to help keep track of the instructions about where and how to use these. Pharmacies typically print the medication instructions only on the boxes and not directly on the medication tubes.   If your medication is too expensive, please contact our office at [phone] option 4 or send us a message through MyChart.   We are unable to tell what your co-pay for medications will be in advance as this is different depending on your insurance coverage. However, we may be able to find a  substitute medication at lower cost or fill out paperwork to get insurance to cover a needed medication.   If a prior authorization is required to get your medication covered by your insurance company, please allow us 1-2 business days to complete this process.  Drug prices often vary depending on where the prescription is filled and some pharmacies may offer cheaper prices.  The website www.goodrx.com contains coupons for medications through different pharmacies. The prices here do not account for what the cost may be with help from insurance (it may be cheaper with your insurance), but the website can give you the price if you did not use any insurance.  - You can print the associated coupon and take it with your prescription to the pharmacy.  - You may also stop by our office during regular business hours and pick up a GoodRx coupon card.  - If you need your prescription sent electronically to a different pharmacy, notify our office through [facility] MyChart or by phone at [phone] option 4.     Wound Care Instructions  1. Cleanse wound gently with soap and water once a day then pat dry with clean gauze. Apply a thing coat of Petrolatum (petroleum jelly, "Vaseline") over the wound (unless you have an allergy to this). We recommend that you use a new, sterile tube of Vaseline. Do not pick or remove scabs. Do not remove the yellow or white "healing tissue" from the base of the wound.    2. Cover the wound with fresh, clean, nonstick gauze and secure with paper tape. You may use Band-Aids in place of gauze and tape if the would is small enough, but would recommend trimming much of the tape off as there is often too much. Sometimes Band-Aids can irritate the skin.  3. You should call the office for your biopsy report after 1 week if you have not already been contacted.  4. If you experience any problems, such as abnormal amounts of bleeding, swelling, significant bruising, significant pain,  or evidence of infection, please call the office immediately.  5. FOR ADULT SURGERY PATIENTS: If you need something for pain relief you may take 1 extra strength Tylenol (acetaminophen) AND 2 Ibuprofen (200mg each) together every 4 hours as needed for pain. (do not take these if you are allergic to them or if you have a reason you should not take them.) Typically, you may only need pain medication for 1 to 3 days.     

## 2020-05-31 ENCOUNTER — Telehealth: Payer: Self-pay

## 2020-05-31 ENCOUNTER — Encounter: Payer: Self-pay | Admitting: Dermatology

## 2020-05-31 NOTE — Telephone Encounter (Signed)
Tried to call patient regarding surgery - no voicemail/hd

## 2020-06-06 ENCOUNTER — Ambulatory Visit: Payer: Medicaid Other | Admitting: Dermatology

## 2020-06-06 ENCOUNTER — Telehealth: Payer: Self-pay

## 2020-06-06 NOTE — Telephone Encounter (Signed)
Called patient regarding 11:30 suture removal. One phone number Claiborne Billings called and patient did not answer. I called second number on file. Spoke with spouse. Just advised him for patient to return call. Spouse states "Oh the stitches on her forehead I am going to take them out. I take out all of her stitches."   Appointment was cancelled.

## 2020-06-13 ENCOUNTER — Telehealth: Payer: Self-pay

## 2020-06-13 NOTE — Telephone Encounter (Signed)
-----   Message from Ralene Bathe, MD sent at 06/01/2020  4:27 PM EDT ----- Diagnosis Skin (M), left post scalp, excision PILAR CYST  Benign cyst

## 2020-06-13 NOTE — Telephone Encounter (Signed)
Unable to leave a message as voicemail box is not set up.

## 2020-06-15 IMAGING — US US OB COMP LESS 14 WK
1 series · 14 of 28 positions shown · non-contrast
Comparison: None.

CLINICAL DATA: Vaginal bleeding

EXAM:
OBSTETRIC <14 WK ULTRASOUND
TECHNIQUE: Transabdominal ultrasound was performed for evaluation of the
gestation as well as the maternal uterus and adnexal regions.

[Series 1: us ob comp less 14 wk · 14 of 29 slices shown]
[im 2/29]
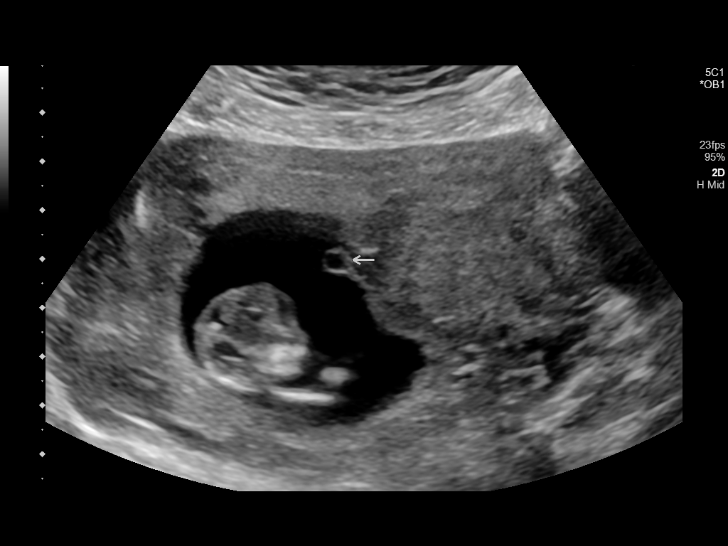
[im 4/29]
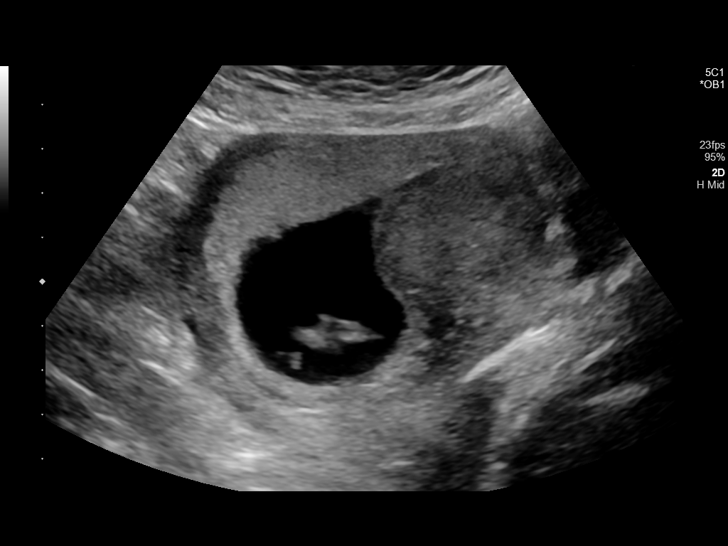
[im 6/29]
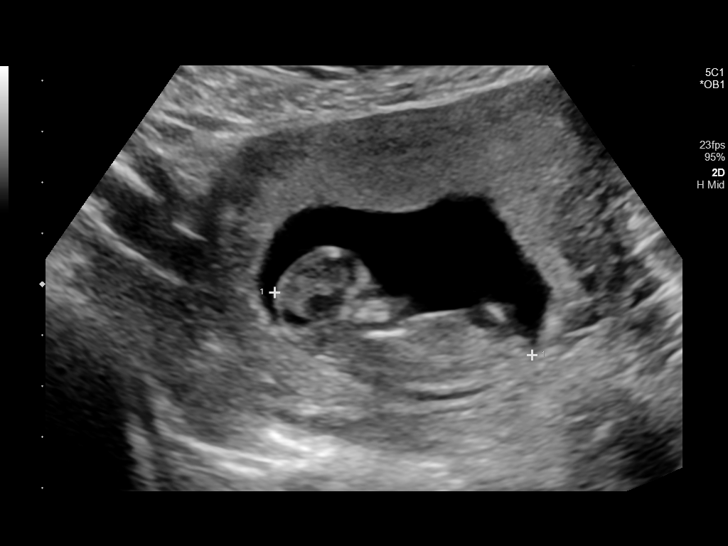
[im 8/29]
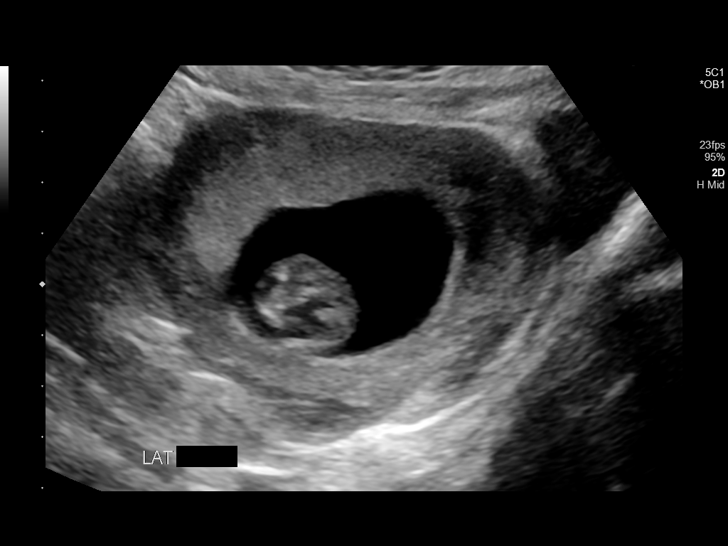
[im 10/29]
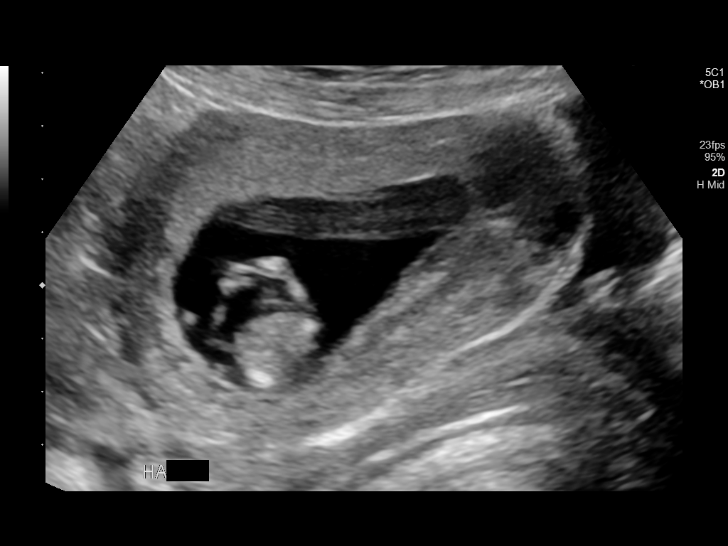
[im 12/29]
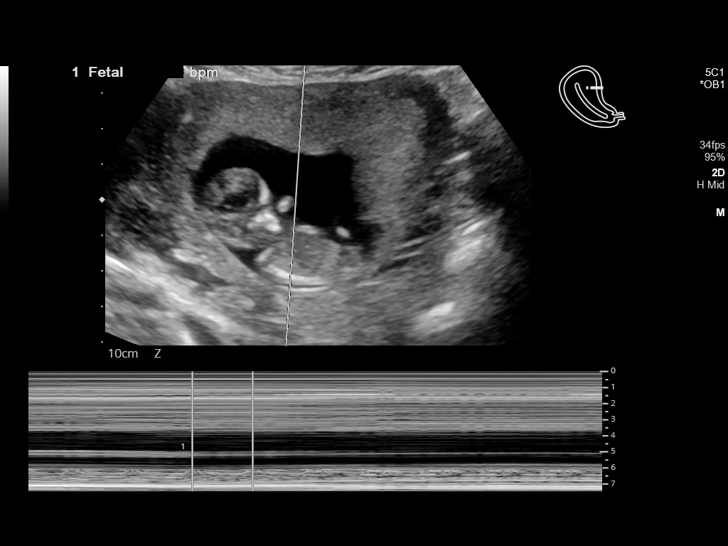
[im 14/29]
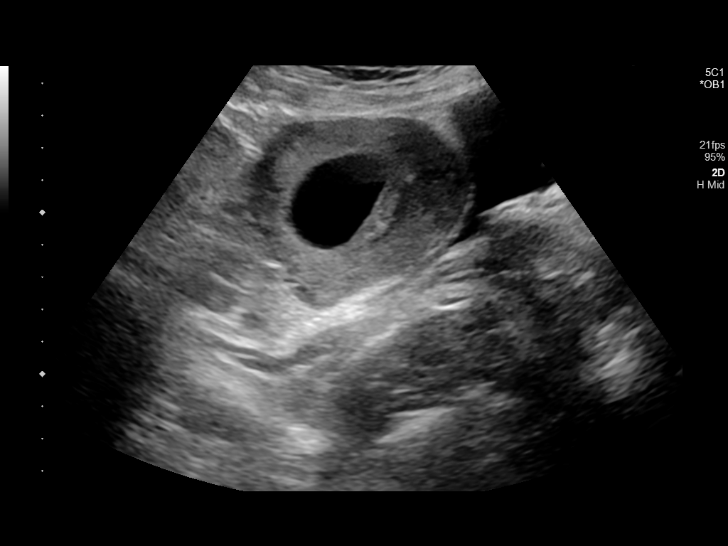
[im 16/29]
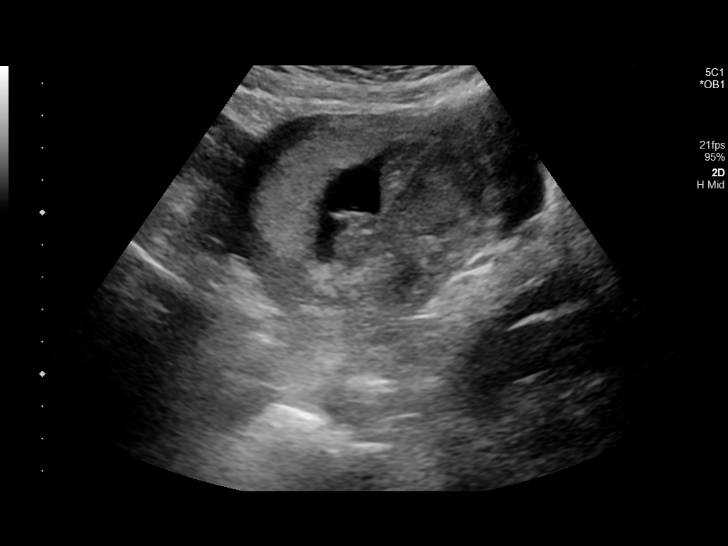
[im 18/29]
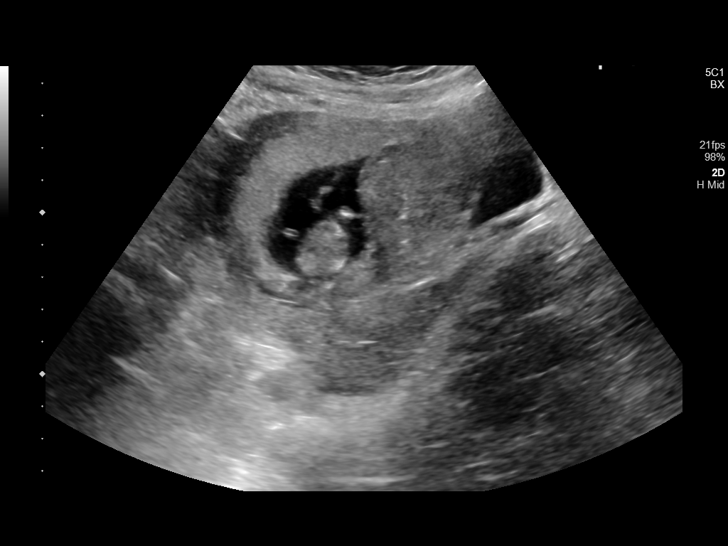
[im 20/29]
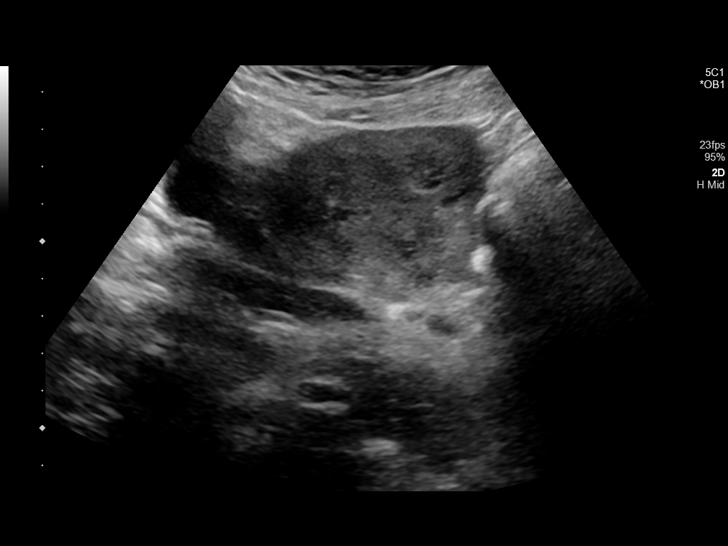
[im 22/29]
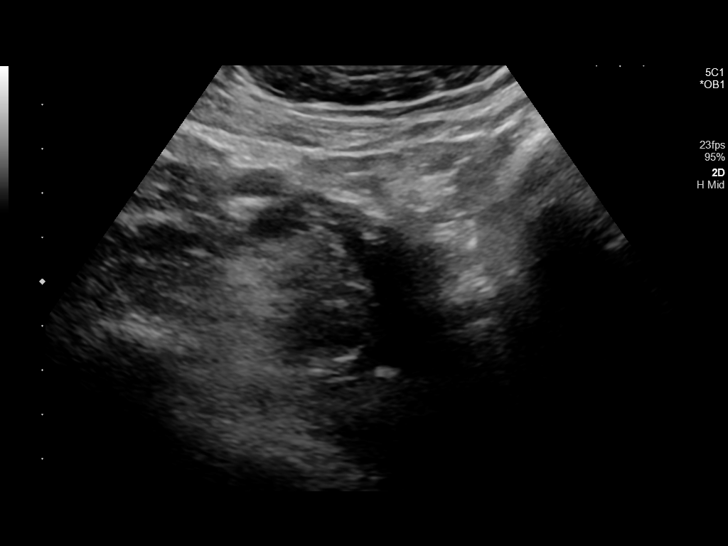
[im 24/29]
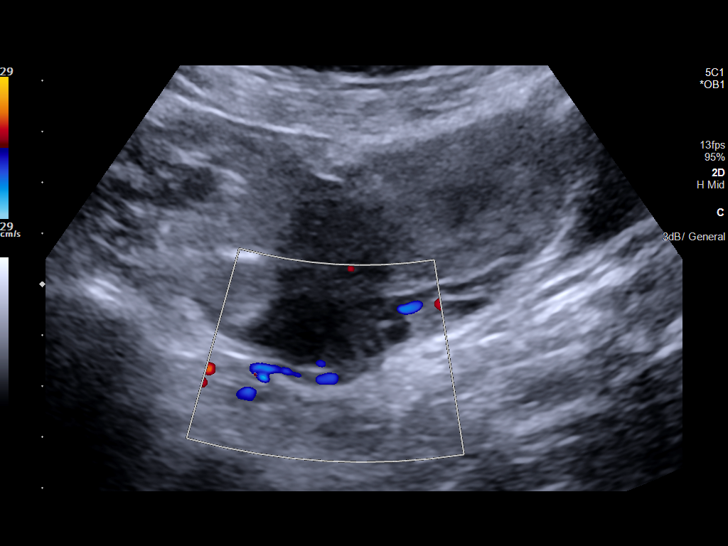
[im 26/29]
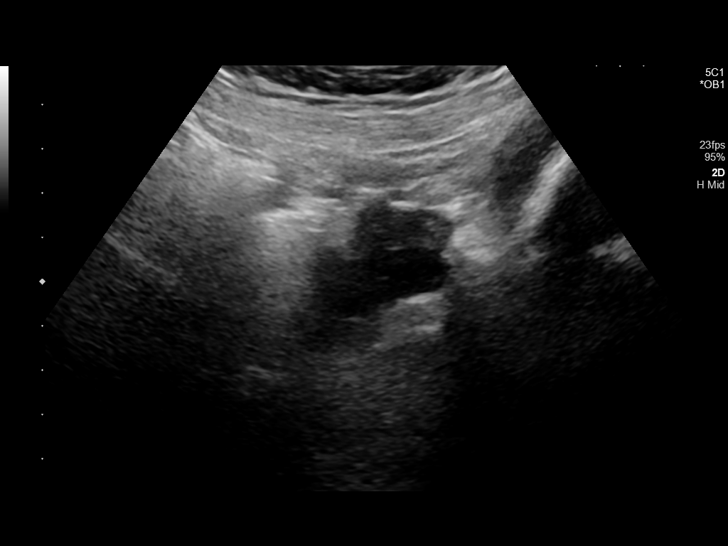
[im 29/29]
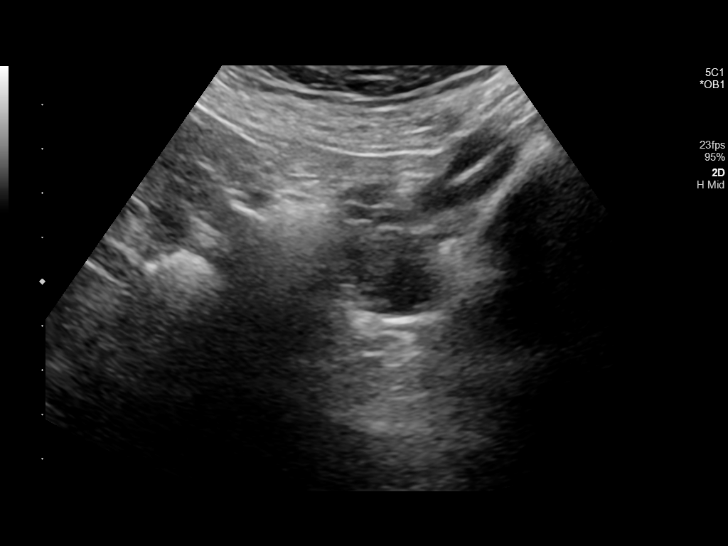

[14 of 28 positions shown; findings below may reference images not displayed]

FINDINGS: Intrauterine gestational sac: Single

Yolk sac:  Visualized.

Embryo:  Visualized.

Cardiac Activity: Visualized.

Heart Rate: 169 bpm

CRL: 51.7  mm   11 w 6 d                  US EDC: 10/23/2019

Subchorionic hemorrhage:  None visualized.

Maternal uterus/adnexae: No pelvic free fluid. Right ovarian corpus
luteum cyst.
IMPRESSION: Single live intrauterine pregnancy.

## 2020-07-04 ENCOUNTER — Telehealth: Payer: Self-pay

## 2020-07-04 NOTE — Telephone Encounter (Signed)
Unable to leave a message. Voicemail box not set up.

## 2020-07-04 NOTE — Telephone Encounter (Signed)
-----   Message from Ralene Bathe, MD sent at 06/01/2020  4:27 PM EDT ----- Diagnosis Skin (M), left post scalp, excision PILAR CYST  Benign cyst

## 2020-07-05 ENCOUNTER — Telehealth: Payer: Self-pay

## 2020-07-05 NOTE — Telephone Encounter (Signed)
-----   Message from Ralene Bathe, MD sent at 06/01/2020  4:27 PM EDT ----- Diagnosis Skin (M), left post scalp, excision PILAR CYST  Benign cyst

## 2020-07-05 NOTE — Telephone Encounter (Signed)
Unable to leave a message voicemail box not set up.

## 2020-12-06 NOTE — Telephone Encounter (Signed)
[facility] rcvd/charged 11/12/19

## 2022-05-09 ENCOUNTER — Ambulatory Visit: Payer: Medicaid Other | Admitting: Dermatology

## 2023-09-08 ENCOUNTER — Ambulatory Visit: Admitting: Dermatology

## 2023-09-08 DIAGNOSIS — Z808 Family history of malignant neoplasm of other organs or systems: Secondary | ICD-10-CM

## 2023-09-08 DIAGNOSIS — L578 Other skin changes due to chronic exposure to nonionizing radiation: Secondary | ICD-10-CM | POA: Diagnosis not present

## 2023-09-08 DIAGNOSIS — L814 Other melanin hyperpigmentation: Secondary | ICD-10-CM

## 2023-09-08 DIAGNOSIS — L821 Other seborrheic keratosis: Secondary | ICD-10-CM

## 2023-09-08 DIAGNOSIS — Z1283 Encounter for screening for malignant neoplasm of skin: Secondary | ICD-10-CM | POA: Diagnosis not present

## 2023-09-08 DIAGNOSIS — D2262 Melanocytic nevi of left upper limb, including shoulder: Secondary | ICD-10-CM

## 2023-09-08 DIAGNOSIS — D225 Melanocytic nevi of trunk: Secondary | ICD-10-CM

## 2023-09-08 DIAGNOSIS — D229 Melanocytic nevi, unspecified: Secondary | ICD-10-CM

## 2023-09-08 DIAGNOSIS — D1801 Hemangioma of skin and subcutaneous tissue: Secondary | ICD-10-CM

## 2023-09-08 DIAGNOSIS — W908XXA Exposure to other nonionizing radiation, initial encounter: Secondary | ICD-10-CM | POA: Diagnosis not present

## 2023-09-08 NOTE — Progress Notes (Signed)
   New Patient Visit   Subjective  Jillian Phillips is a 43 y.o. female who presents for the following: Skin Cancer Screening and Full Body Skin Exam check spot L inframammary/breast, has had for years, no hx of skin cancer, Mother with hx of skin cancer not sure type  The patient presents for Total-Body Skin Exam (TBSE) for skin cancer screening and mole check. The patient has spots, moles and lesions to be evaluated, some may be new or changing and the patient may have concern these could be cancer.    The following portions of the chart were reviewed this encounter and updated as appropriate: medications, allergies, medical history  Review of Systems:  No other skin or systemic complaints except as noted in HPI or Assessment and Plan.  Objective  Well appearing patient in no apparent distress; mood and affect are within normal limits.  A full examination was performed including scalp, head, eyes, ears, nose, lips, neck, chest, axillae, abdomen, back, buttocks, bilateral upper extremities, bilateral lower extremities, hands, feet, fingers, toes, fingernails, and toenails. All findings within normal limits unless otherwise noted below.   Relevant physical exam findings are noted in the Assessment and Plan.    Assessment & Plan   SKIN CANCER SCREENING PERFORMED TODAY.  ACTINIC DAMAGE chest - Chronic condition, secondary to cumulative UV/sun exposure - diffuse scaly erythematous macules with underlying dyspigmentation - Recommend daily broad spectrum sunscreen SPF 30+ to sun-exposed areas, reapply every 2 hours as needed.  - Staying in the shade or wearing long sleeves, sun glasses (UVA+UVB protection) and wide brim hats (4-inch brim around the entire circumference of the hat) are also recommended for sun protection.  - Call for new or changing lesions.  LENTIGINES, SEBORRHEIC KERATOSES, HEMANGIOMAS - Benign normal skin lesions - Benign-appearing - Call for any  changes  MELANOCYTIC NEVI - Tan-brown and/or pink-flesh-colored symmetric macules and papules - L post upper arm 4.36mm brown macule - L inframammary 4.0 x 3.2mm tan macule - Benign appearing on exam today - Observation - Call clinic for new or changing moles - Recommend daily use of broad spectrum spf 30+ sunscreen to sun-exposed areas.   FAMILY HISTORY OF SKIN CANCER What type(s):not sure type Who affected: mother      Return for 1-2 yrsTBSE.  I, Grayce Saunas, RMA, am acting as scribe for Rexene Rattler, MD .   Documentation: I have reviewed the above documentation for accuracy and completeness, and I agree with the above.  Rexene Rattler, MD

## 2023-09-08 NOTE — Patient Instructions (Addendum)
 Melanoma ABCDEs  Melanoma is the most dangerous type of skin cancer, and is the leading cause of death from skin disease.  You are more likely to develop melanoma if you: Have light-colored skin, light-colored eyes, or red or blond hair Spend a lot of time in the sun Tan regularly, either outdoors or in a tanning bed Have had blistering sunburns, especially during childhood Have a close family member who has had a melanoma Have atypical moles or large birthmarks  Early detection of melanoma is key since treatment is typically straightforward and cure rates are extremely high if we catch it early.   The first sign of melanoma is often a change in a mole or a new dark spot.  The ABCDE system is a way of remembering the signs of melanoma.  A for asymmetry:  The two halves do not match. B for border:  The edges of the growth are irregular. C for color:  A mixture of colors are present instead of an even brown color. D for diameter:  Melanomas are usually (but not always) greater than 6mm - the size of a pencil eraser. E for evolution:  The spot keeps changing in size, shape, and color.  Please check your skin once per month between visits. You can use a small mirror in front and a large mirror behind you to keep an eye on the back side or your body.   If you see any new or changing lesions before your next follow-up, please call to schedule a visit.  Please continue daily skin protection including broad spectrum sunscreen SPF 30+ to sun-exposed areas, reapplying every 2 hours as needed when you're outdoors.   Staying in the shade or wearing long sleeves, sun glasses (UVA+UVB protection) and wide brim hats (4-inch brim around the entire circumference of the hat) are also recommended for sun protection.    Recommend daily broad spectrum sunscreen SPF 30+ to sun-exposed areas, reapply every 2 hours as needed. Call for new or changing lesions.  Staying in the shade or wearing long sleeves, sun  glasses (UVA+UVB protection) and wide brim hats (4-inch brim around the entire circumference of the hat) are also recommended for sun protection.     Due to recent changes in healthcare laws, you may see results of your pathology and/or laboratory studies on MyChart before the doctors have had a chance to review them. We understand that in some cases there may be results that are confusing or concerning to you. Please understand that not all results are received at the same time and often the doctors may need to interpret multiple results in order to provide you with the best plan of care or course of treatment. Therefore, we ask that you please give us  2 business days to thoroughly review all your results before contacting the office for clarification. Should we see a critical lab result, you will be contacted sooner.   If You Need Anything After Your Visit  If you have any questions or concerns for your doctor, please call our main line at [phone] and press option 4 to reach your doctor's medical assistant. If no one answers, please leave a voicemail as directed and we will return your call as soon as possible. Messages left after 4 pm will be answered the following business day.   You may also send us  a message via MyChart. We typically respond to MyChart messages within 1-2 business days.  For prescription refills, please ask your pharmacy to contact  our office. Our fax number is [phone].  If you have an urgent issue when the clinic is closed that cannot wait until the next business day, you can page your doctor at the number below.    Please note that while we do our best to be available for urgent issues outside of office hours, we are not available 24/7.   If you have an urgent issue and are unable to reach us , you may choose to seek medical care at your doctor's office, retail clinic, urgent care center, or emergency room.  If you have a medical emergency, please immediately  call 911 or go to the emergency department.  Pager Numbers  - Dr. Hester: [phone]  - Dr. Jackquline: [phone]  - Dr. Claudene: [phone]   In the event of inclement weather, please call our main line at [phone] for an update on the status of any delays or closures.  Dermatology Medication Tips: Please keep the boxes that topical medications come in in order to help keep track of the instructions about where and how to use these. Pharmacies typically print the medication instructions only on the boxes and not directly on the medication tubes.   If your medication is too expensive, please contact our office at [phone] option 4 or send us  a message through MyChart.   We are unable to tell what your co-pay for medications will be in advance as this is different depending on your insurance coverage. However, we may be able to find a substitute medication at lower cost or fill out paperwork to get insurance to cover a needed medication.   If a prior authorization is required to get your medication covered by your insurance company, please allow us  1-2 business days to complete this process.  Drug prices often vary depending on where the prescription is filled and some pharmacies may offer cheaper prices.  The website www.goodrx.com contains coupons for medications through different pharmacies. The prices here do not account for what the cost may be with help from insurance (it may be cheaper with your insurance), but the website can give you the price if you did not use any insurance.  - You can print the associated coupon and take it with your prescription to the pharmacy.  - You may also stop by our office during regular business hours and pick up a GoodRx coupon card.  - If you need your prescription sent electronically to a different pharmacy, notify our office through [facility] or by phone at [phone] option 4.     Si Usted Necesita Algo Despus de Su  Visita  Tambin puede enviarnos un mensaje a travs de Clinical cytogeneticist. Por lo general respondemos a los mensajes de MyChart en el transcurso de 1 a 2 das hbiles.  Para renovar recetas, por favor pida a su farmacia que se ponga en contacto con nuestra oficina. Randi lakes de fax es [place] [phone].  Si tiene un asunto urgente cuando la clnica est cerrada y que no puede esperar hasta el siguiente da hbil, puede llamar/localizar a su doctor(a) al nmero que aparece a continuacin.   Por favor, tenga en cuenta que aunque hacemos todo lo posible para estar disponibles para asuntos urgentes fuera del horario de [place], no estamos disponibles las 24 horas del da, los 7 [address]  [address] de la [place].   Si tiene un problema urgente y no puede comunicarse con nosotros, puede optar por buscar atencin mdica  en el consultorio de su doctor(a), en una clnica  privada, en un centro de atencin urgente o en una sala de emergencias.  Si tiene Engineer, drilling, por favor llame inmediatamente al 911 o vaya a la sala de emergencias.  Nmeros de bper  - Dr. Hester: [phone]  - Dra. Jackquline: [phone]  - Dr. Claudene: [phone]   En caso de inclemencias del tiempo, por favor llame a landry capes principal al [phone] para una actualizacin sobre el [place] de cualquier retraso o cierre.  Consejos para la medicacin en dermatologa: Por favor, guarde las cajas en las que vienen los medicamentos de uso tpico para ayudarle a seguir las instrucciones sobre dnde y cmo usarlos. Las farmacias generalmente imprimen las instrucciones del medicamento slo en las cajas y no directamente en los tubos del [place].   Si su medicamento es muy caro, por favor, pngase en contacto con landry rieger llamando al [phone] y presione la opcin 4 o envenos un mensaje a travs de Clinical cytogeneticist.   No podemos decirle cul ser su copago por los medicamentos por adelantado ya que esto es diferente dependiendo de  la cobertura de su seguro. Sin embargo, es posible que podamos encontrar un medicamento sustituto a Audiological scientist un formulario para que el seguro cubra el medicamento que se considera necesario.   Si se requiere una autorizacin previa para que su compaa de seguros malta su medicamento, por favor permtanos de 1 a 2 das hbiles para completar este proceso.  Los precios de los medicamentos varan con frecuencia dependiendo del Environmental consultant de dnde se surte la receta y alguna farmacias pueden ofrecer precios ms baratos.  El sitio web www.goodrx.com tiene cupones para medicamentos de Health and safety inspector. Los precios aqu no tienen en cuenta lo que podra costar con la ayuda del seguro (puede ser ms barato con su seguro), pero el sitio web puede darle el precio si no utiliz Tourist information centre manager.  - Puede imprimir el cupn correspondiente y llevarlo con su receta a la farmacia.  - Tambin puede pasar por nuestra oficina durante el horario de atencin regular y Education officer, museum una tarjeta de cupones de GoodRx.  - Si necesita que su receta se enve electrnicamente a una farmacia diferente, informe a nuestra oficina a travs de MyChart de [place] o por telfono llamando al [phone] y presione la opcin 4.

## 2023-11-10 ENCOUNTER — Encounter: Payer: Self-pay | Admitting: Family Medicine

## 2023-11-10 ENCOUNTER — Ambulatory Visit: Admitting: Family Medicine

## 2023-11-10 VITALS — BP 170/109 | HR 94 | Temp 97.9°F | Ht 68.0 in | Wt 159.0 lb

## 2023-11-10 DIAGNOSIS — Z8619 Personal history of other infectious and parasitic diseases: Secondary | ICD-10-CM

## 2023-11-10 DIAGNOSIS — Z87898 Personal history of other specified conditions: Secondary | ICD-10-CM

## 2023-11-10 DIAGNOSIS — F411 Generalized anxiety disorder: Secondary | ICD-10-CM | POA: Diagnosis not present

## 2023-11-10 DIAGNOSIS — R030 Elevated blood-pressure reading, without diagnosis of hypertension: Secondary | ICD-10-CM

## 2023-11-10 DIAGNOSIS — Z23 Encounter for immunization: Secondary | ICD-10-CM | POA: Diagnosis not present

## 2023-11-10 DIAGNOSIS — F17200 Nicotine dependence, unspecified, uncomplicated: Secondary | ICD-10-CM

## 2023-11-10 DIAGNOSIS — F902 Attention-deficit hyperactivity disorder, combined type: Secondary | ICD-10-CM | POA: Diagnosis not present

## 2023-11-10 DIAGNOSIS — F1410 Cocaine abuse, uncomplicated: Secondary | ICD-10-CM

## 2023-11-10 DIAGNOSIS — Z833 Family history of diabetes mellitus: Secondary | ICD-10-CM

## 2023-11-10 DIAGNOSIS — F322 Major depressive disorder, single episode, severe without psychotic features: Secondary | ICD-10-CM

## 2023-11-10 DIAGNOSIS — D509 Iron deficiency anemia, unspecified: Secondary | ICD-10-CM

## 2023-11-10 DIAGNOSIS — Z7689 Persons encountering health services in other specified circumstances: Secondary | ICD-10-CM

## 2023-11-10 DIAGNOSIS — F1210 Cannabis abuse, uncomplicated: Secondary | ICD-10-CM

## 2023-11-10 DIAGNOSIS — Z1231 Encounter for screening mammogram for malignant neoplasm of breast: Secondary | ICD-10-CM

## 2023-11-10 DIAGNOSIS — B182 Chronic viral hepatitis C: Secondary | ICD-10-CM

## 2023-11-10 NOTE — Progress Notes (Signed)
 New Patient Office Visit  Introduced to nurse practitioner role and practice setting.  All questions answered.  Discussed provider/patient relationship and expectations.  Subjective    Patient ID: Jillian Phillips, female    DOB: 08-19-1980  Age: 43 y.o. MRN: [id number]  CC:  Chief Complaint  Patient presents with   Establish Care    HPI  Discussed the use of AI scribe software for clinical note transcription with the patient, who gave verbal consent to proceed.  History of Present Illness Jillian Phillips is a 43 year old female who presents for establishing care and medication management.  She has a history of ADHD and was previously on Adderall, which was discontinued after a failed drug screen. Since discontinuation, she has experienced increased depression and anxiety, describing her mood as 'super depressed, super anxious, just like really all over the place.' She has been without Adderall for five months, leading to a 'roller coaster' of emotions.  She has a history of anxiety and depression, with her father also having depression. She is currently taking Abilify  10 mg daily and Effexor  150 mg once daily. Her mood is fluctuating, and she experiences significant anxiety, impacting her daily life.  She has a history of substance use, including cocaine, with the last use being one month ago. 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 She uses marijuana daily and smokes half a pack of cigarettes per day. She does not consume alcohol.  She has a history of iron deficiency anemia, which she attributes to being hereditary as her mother has the same condition. She does not currently take an iron supplement.  She has hepatitis C and has not seen an infectious disease specialist for this condition. She also has a history of HSV genital but reports no outbreaks and does not take preventative medication.  Socially, she has been with her partner for 22 years. They have three children,  including two with autism. She works at a [employer] in [place] and enjoys DIY projects at home. She reports financial strain due to grocery costs and describes her partner as the stability in the household, managing appointments and daily routines for their children.   Flowsheet Row Office Visit from 11/10/2023 in [facility]  PHQ-9 Total Score 18      11/10/2023    3:08 PM  GAD 7 : Generalized Anxiety Score  Nervous, Anxious, on Edge 2  Control/stop worrying 2  Worry too much - different things 2  Trouble relaxing 2  Restless 2  Easily annoyed or irritable 2  Afraid - awful might happen 2  Total GAD 7 Score 14  Anxiety Difficulty Somewhat difficult       Outpatient Encounter Medications as of 11/10/2023  Medication Sig   amphetamine-dextroamphetamine (ADDERALL) 30 MG tablet Take 30 mg by mouth daily.   ARIPiprazole  (ABILIFY ) 10 MG tablet Take 10 mg by mouth daily.   venlafaxine  XR (EFFEXOR -XR) 150 MG 24 hr capsule Take 150 mg by mouth daily with breakfast.   [DISCONTINUED] paragard  intrauterine copper  IUD IUD 1 Intra Uterine Device (1 each total) by Intrauterine route once for 1 dose.   [DISCONTINUED] benzocaine -Menthol  (DERMOPLAST) 20-0.5 % AERO Apply 1 application topically as needed for irritation (perineal discomfort). (Patient not taking: Reported on 10/25/2019)   [DISCONTINUED] clindamycin  (CLEOCIN -T) 1 % lotion May use daily on face pending OB clearance (Patient not taking: Reported on 09/27/2019)   [DISCONTINUED] ferrous sulfate  325 (65 FE) MG tablet Take  1 tablet (325 mg total) by mouth 2 (two) times daily with a meal. (Patient not taking: Reported on 10/25/2019)   [DISCONTINUED] ibuprofen  (ADVIL ) 600 MG tablet Take 1 tablet (600 mg total) by mouth every 6 (six) hours. (Patient not taking: Reported on 10/25/2019)   [DISCONTINUED] labetalol  (NORMODYNE ) 300 MG tablet Take 2 tablets (600 mg total) by mouth 3 (three) times daily. (Patient not  taking: Reported on 10/25/2019)   [DISCONTINUED] mupirocin  ointment (BACTROBAN ) 2 % Apply 1 application topically daily. Qd to excision site (Patient not taking: Reported on 10/25/2019)   [DISCONTINUED] NIFEdipine  (PROCARDIA ) 10 MG capsule Take 1 capsule (10 mg total) by mouth 3 (three) times daily. (Patient not taking: Reported on 10/25/2019)   No facility-administered encounter medications on file as of 11/10/2023.    Past Medical History:  Diagnosis Date   ADD (attention deficit disorder)    Anemia    Anxiety    Chronic hepatitis C (HCC)    Depression    Genital HSV    History of substance use disorder    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   Marijuana use    Seizures (HCC)    Substance abuse (HCC)     Past Surgical History:  Procedure Laterality Date   NO PAST SURGERIES      Family History  Problem Relation Age of Onset   Hypertension Mother    Heart disease Mother        arrhythmia   COPD Father    Hypertension Father    Autism Son    Cancer Maternal Grandmother    Cancer Maternal Grandfather    Cancer Paternal Grandfather    Diabetes Neg Hx     Social History   Socioeconomic History   Marital status: Media planner    Spouse name: Toribio Pesa   Number of children: 3   Years of education: Not on file   Highest education level: Not on file  Occupational History   Not on file  Tobacco Use   Smoking status: Every Day    Current packs/day: 0.25    Types: Cigarettes   Smokeless tobacco: Never  Vaping Use   Vaping status: Never Used  Substance and Sexual Activity   Alcohol use: No   Drug use: Not Currently    Types: Marijuana, Amphetamines, Cocaine, Heroin   Sexual activity: Yes    Comment: undecided  Other Topics Concern   Not on file  Social History Narrative   Not on file   Social Drivers of Health   Financial Resource Strain: Not on file  Food Insecurity: Food Insecurity Present (11/10/2023)   Hunger Vital Sign    Worried About Running Out of Food  in the Last Year: Sometimes true    Ran Out of Food in the Last Year: Sometimes true  Transportation Needs: No Transportation Needs (11/10/2023)   PRAPARE - Administrator, Civil Service (Medical): No    Lack of Transportation (Non-Medical): No  Physical Activity: Insufficiently Active (11/10/2023)   Exercise Vital Sign    Days of Exercise per Week: 3 days    Minutes of Exercise per Session: 20 min  Stress: Stress Concern Present (11/10/2023)   [employer] of Occupational Health - Occupational Stress Questionnaire    Feeling of Stress: Very much  Social Connections: Moderately Isolated (11/10/2023)   Social Connection and Isolation Panel    Frequency of Communication with Friends and Family: Twice a week    Frequency of Social Gatherings with  Friends and Family: Once a week    Attends Religious Services: Never    Database administrator or Organizations: No    Attends Engineer, structural: Not on file    Marital Status: Living with partner  Intimate Partner Violence: Not At Risk (11/10/2023)   Humiliation, Afraid, Rape, and Kick questionnaire    Fear of Current or Ex-Partner: No    Emotionally Abused: No    Physically Abused: No    Sexually Abused: No    ROS     Objective    BP (!) 170/109   Pulse 94   Temp 97.9 F (36.6 C) (Oral)   Ht 5' 8 (1.727 m)   Wt 159 lb (72.1 kg)   SpO2 100%   BMI 24.18 kg/m   Physical Exam Constitutional:      General: She is not in acute distress.    Appearance: Normal appearance. She is obese. She is not ill-appearing, toxic-appearing or diaphoretic.  HENT:     Head: Normocephalic.     Nose: Nose normal.     Mouth/Throat:     Mouth: Mucous membranes are moist.     Pharynx: Oropharynx is clear.  Eyes:     Extraocular Movements: Extraocular movements intact.     Pupils: Pupils are equal, round, and reactive to light.  Cardiovascular:     Rate and Rhythm: Normal rate and regular rhythm.     Pulses: Normal  pulses.     Heart sounds: Normal heart sounds. No murmur heard.    No friction rub. No gallop.  Pulmonary:     Effort: No respiratory distress.     Breath sounds: No stridor. No wheezing, rhonchi or rales.  Chest:     Chest wall: No tenderness.  Musculoskeletal:     Right lower leg: No edema.     Left lower leg: No edema.  Lymphadenopathy:     Cervical: No cervical adenopathy.  Skin:    General: Skin is warm and dry.     Capillary Refill: Capillary refill takes less than 2 seconds.  Neurological:     General: No focal deficit present.     Mental Status: She is alert and oriented to person, place, and time. Mental status is at baseline.  Psychiatric:        Attention and Perception: Attention normal.        Mood and Affect: Mood is anxious. Affect is tearful.        Speech: Speech is rapid and pressured.        Behavior: Behavior normal. Behavior is cooperative.        Thought Content: Thought content normal. Thought content is not delusional. Thought content does not include homicidal or suicidal ideation. Thought content does not include homicidal or suicidal plan.        Cognition and Memory: Cognition and memory normal.        Judgment: Judgment normal.         Assessment & Plan:  Assessment and Plan Assessment & Plan Depression and Generalized Anxiety Disorder Chronic depression and anxiety with a family history of depression. Reports increased stress due to lack of access to adderall for ADHD - Continue Abilify  10 mg daily - Continue Effexor  150 mg daily - Refer to psychiatry for specialized management of mood disorders - Referral to Integrated Behavioral Health  Patient and/or legal guardian verbally consented to [facility] services about presenting concerns and psychiatric consultation as appropriate.  The services will be billed as appropriate for the patient  Attention-Deficit Hyperactivity Disorder (ADHD) ADHD previously managed with  Adderall, discontinued after a failed drug screen. Reports difficulty managing symptoms without medication. Declined Wellbutrin due to negative feedback from others. - Refer to psychiatry for specialized management of ADHD - Bridge with Adderall for one month pending drug screening results - Previous adderall was filled July 2024  Cocaine Use Disorder - Endorses previous use about one month ago - in early remission -  the potential risks of stimulant medications with a history of substance use - Discussed urine drug screen for adderall prescription - Refer to psychiatry for management of substance use disorder  Cannabis Use Disorder, Daily Use Daily cannabis use reported. Discussed in the context of overall mental health and substance use history. - Recommend cessation  Tobacco Use Disorder, Daily Use Daily tobacco use, approximately half a pack per day. - pt aware of risks  Hepatitis C, Chronic Chronic hepatitis C with no prior referral to infectious disease. 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  Iron Deficiency Anemia Iron deficiency anemia with no current iron supplementation. Last CBC in 2021. 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  No recent seizures reported.  Elevated Blood Pressure w/o diag of HTN - High in office, extreme anxiety durinf visit GOAL<120/80 - hx of preeclampsia with previous pregnancy - Discussed risks of uncontrolled blood pressure, especially with stimulant use - Monitor blood pressure at home daily and maintain a log - Return in 2-3 weeks with blood pressure log for further evaluation - DASH diet   Generalized anxiety disorder -     Ambulatory referral to  Psychiatry -     TSH -     Amb ref to Integrated Behavioral Health  Elevated blood-pressure reading without diagnosis of hypertension -     Lipid panel -     TSH -     Comprehensive metabolic panel with GFR -     VITAMIN D 25 Hydroxy (Vit-D Deficiency, Fractures)  Attention deficit hyperactivity disorder (ADHD), combined type -     Ambulatory referral to Psychiatry -     Drug Screen, Urine  Depression, major, single episode, severe (HCC) -     Amb ref to Integrated Behavioral Health  Cocaine use disorder (HCC) -     Amb ref to Integrated Behavioral Health  Iron deficiency anemia, unspecified iron deficiency anemia type -     CBC -     Iron, TIBC and Ferritin Panel -     Vitamin B12  Family history of diabetes mellitus -     Hemoglobin A1c  Chronic hepatitis C without hepatic coma (HCC) -     Ambulatory referral to Infectious Disease  Need for influenza vaccination -     Flu vaccine trivalent PF, 6mos and older(Flulaval,Afluria,Fluarix,Fluzone)  Encounter to establish care with new doctor  History of seizure  History of PCR DNA positive for HSV2  Tobacco use disorder  Cannabis abuse, daily use  Encounter for screening mammogram for malignant neoplasm of breast -     3D Screening Mammogram, Left and Right    Return in about 2 weeks (around 11/24/2023) for BP check .   Curtis DELENA Boom, FNP

## 2023-11-11 ENCOUNTER — Ambulatory Visit: Payer: Self-pay | Admitting: Family Medicine

## 2023-11-11 LAB — COMPREHENSIVE METABOLIC PANEL WITH GFR
ALT: 92 IU/L — ABNORMAL HIGH (ref 0–32)
AST: 51 IU/L — ABNORMAL HIGH (ref 0–40)
Albumin: 4.6 g/dL (ref 3.9–4.9)
Alkaline Phosphatase: 58 IU/L (ref 41–116)
BUN/Creatinine Ratio: 18 (ref 9–23)
BUN: 14 mg/dL (ref 6–24)
Bilirubin Total: 0.5 mg/dL (ref 0.0–1.2)
CO2: 25 mmol/L (ref 20–29)
Calcium: 9.5 mg/dL (ref 8.7–10.2)
Chloride: 102 mmol/L (ref 96–106)
Creatinine, Ser: 0.79 mg/dL (ref 0.57–1.00)
Globulin, Total: 2.9 g/dL (ref 1.5–4.5)
Glucose: 93 mg/dL (ref 70–99)
Potassium: 4.3 mmol/L (ref 3.5–5.2)
Sodium: 139 mmol/L (ref 134–144)
Total Protein: 7.5 g/dL (ref 6.0–8.5)
eGFR: 96 mL/min/1.73 (ref >59)

## 2023-11-11 LAB — LIPID PANEL
Chol/HDL Ratio: 2.5 ratio (ref 0.0–4.4)
Cholesterol, Total: 167 mg/dL (ref 100–199)
HDL: 66 mg/dL (ref >39)
LDL Chol Calc (NIH): 84 mg/dL (ref 0–99)
Triglycerides: 96 mg/dL (ref 0–149)
VLDL Cholesterol Cal: 17 mg/dL (ref 5–40)

## 2023-11-11 LAB — CBC
Hematocrit: 36.3 % (ref 34.0–46.6)
Hemoglobin: 11.8 g/dL (ref 11.1–15.9)
MCH: 29.4 pg (ref 26.6–33.0)
MCHC: 32.5 g/dL (ref 31.5–35.7)
MCV: 90 fL (ref 79–97)
Platelets: 285 x10E3/uL (ref 150–450)
RBC: 4.02 x10E6/uL (ref 3.77–5.28)
RDW: 12.8 % (ref 11.7–15.4)
WBC: 7.1 x10E3/uL (ref 3.4–10.8)

## 2023-11-11 LAB — IRON,TIBC AND FERRITIN PANEL
Ferritin: 21 ng/mL (ref 15–150)
Iron Saturation: 23 % (ref 15–55)
Iron: 99 ug/dL (ref 27–159)
Total Iron Binding Capacity: 434 ug/dL (ref 250–450)
UIBC: 335 ug/dL (ref 131–425)

## 2023-11-11 LAB — HEMOGLOBIN A1C
Est. average glucose Bld gHb Est-mCnc: 105 mg/dL
Hgb A1c MFr Bld: 5.3 % (ref 4.8–5.6)

## 2023-11-11 LAB — VITAMIN B12: Vitamin B-12: 724 pg/mL (ref 232–1245)

## 2023-11-11 LAB — TSH: TSH: 2.08 u[IU]/mL (ref 0.450–4.500)

## 2023-11-11 LAB — VITAMIN D 25 HYDROXY (VIT D DEFICIENCY, FRACTURES): Vit D, 25-Hydroxy: 26.7 ng/mL — ABNORMAL LOW (ref 30.0–100.0)

## 2023-11-12 LAB — DRUG SCREEN, URINE
Amphetamines, Urine: POSITIVE ng/mL — AB
Barbiturate screen, urine: NEGATIVE ng/mL
Benzodiazepine Quant, Ur: NEGATIVE ng/mL
Cannabinoid Quant, Ur: POSITIVE ng/mL — AB
Cocaine (Metab.): NEGATIVE ng/mL
Opiate Quant, Ur: NEGATIVE ng/mL
PCP Quant, Ur: NEGATIVE ng/mL

## 2023-11-25 ENCOUNTER — Ambulatory Visit: Admitting: Family Medicine

## 2023-12-18 ENCOUNTER — Ambulatory Visit: Admitting: Infectious Diseases

## 2025-07-25 ENCOUNTER — Encounter: Admitting: Dermatology
